# Patient Record
Sex: Female | Born: 1980 | Race: Black or African American | Hispanic: No | Marital: Single | Smoking: Former smoker
Health system: Southern US, Community
[De-identification: ages and names within clinical notes are randomized; demographics above are authoritative.]

## PROBLEM LIST (undated history)

## (undated) DIAGNOSIS — N189 Chronic kidney disease, unspecified: Secondary | ICD-10-CM

## (undated) HISTORY — PX: NO PAST SURGERIES: SHX2092

## (undated) HISTORY — DX: Chronic kidney disease, unspecified: N18.9

---

## 2016-07-17 ENCOUNTER — Emergency Department (HOSPITAL_COMMUNITY)
Admission: EM | Admit: 2016-07-17 | Discharge: 2016-07-18 | Disposition: A | Discharge status: Home / Self Care | Payer: Medicaid Other | Attending: Emergency Medicine | Admitting: Emergency Medicine

## 2016-07-17 ENCOUNTER — Emergency Department (HOSPITAL_COMMUNITY): Payer: Medicaid Other

## 2016-07-17 ENCOUNTER — Encounter (HOSPITAL_COMMUNITY): Payer: Self-pay | Admitting: Emergency Medicine

## 2016-07-17 DIAGNOSIS — D500 Iron deficiency anemia secondary to blood loss (chronic): Secondary | ICD-10-CM | POA: Diagnosis not present

## 2016-07-17 DIAGNOSIS — R0602 Shortness of breath: Secondary | ICD-10-CM | POA: Insufficient documentation

## 2016-07-17 DIAGNOSIS — I10 Essential (primary) hypertension: Secondary | ICD-10-CM | POA: Diagnosis not present

## 2016-07-17 DIAGNOSIS — F17200 Nicotine dependence, unspecified, uncomplicated: Secondary | ICD-10-CM | POA: Diagnosis not present

## 2016-07-17 DIAGNOSIS — Z79899 Other long term (current) drug therapy: Secondary | ICD-10-CM | POA: Diagnosis not present

## 2016-07-17 MED ORDER — ALBUTEROL SULFATE (2.5 MG/3ML) 0.083% IN NEBU
5.0000 mg | INHALATION_SOLUTION | Freq: Once | RESPIRATORY_TRACT | Status: DC
  Filled 2016-07-17: qty 6

## 2016-07-17 NOTE — ED Triage Notes (Signed)
Pt states she has shortness of breath  Pt states she has had it "for a little while" and it has been getting progressively worse  Pt states she cannot lay down flat because it is hard to breathe

## 2016-07-18 ENCOUNTER — Encounter (HOSPITAL_COMMUNITY): Payer: Self-pay | Admitting: Emergency Medicine

## 2016-07-18 LAB — I-STAT TROPONIN, ED: TROPONIN I, POC: 0 ng/mL (ref 0.00–0.08)

## 2016-07-18 LAB — CBC WITH DIFFERENTIAL/PLATELET
BASOS ABS: 0 10*3/uL (ref 0.0–0.1)
Basophils Relative: 1 %
Eosinophils Absolute: 0.3 10*3/uL (ref 0.0–0.7)
Eosinophils Relative: 3 %
HCT: 23.7 % — ABNORMAL LOW (ref 36.0–46.0)
HEMOGLOBIN: 7.6 g/dL — AB (ref 12.0–15.0)
LYMPHS ABS: 2.5 10*3/uL (ref 0.7–4.0)
LYMPHS PCT: 31 %
MCH: 24.8 pg — ABNORMAL LOW (ref 26.0–34.0)
MCHC: 32.1 g/dL (ref 30.0–36.0)
MCV: 77.2 fL — AB (ref 78.0–100.0)
Monocytes Absolute: 0.5 10*3/uL (ref 0.1–1.0)
Monocytes Relative: 6 %
NEUTROS ABS: 4.6 10*3/uL (ref 1.7–7.7)
NEUTROS PCT: 59 %
PLATELETS: 250 10*3/uL (ref 150–400)
RBC: 3.07 MIL/uL — AB (ref 3.87–5.11)
RDW: 14.8 % (ref 11.5–15.5)
WBC: 7.9 10*3/uL (ref 4.0–10.5)

## 2016-07-18 LAB — D-DIMER, QUANTITATIVE: D-Dimer, Quant: 0.29 ug/mL-FEU (ref 0.00–0.50)

## 2016-07-18 LAB — I-STAT CHEM 8, ED
BUN: 8 mg/dL (ref 6–20)
CHLORIDE: 103 mmol/L (ref 101–111)
Calcium, Ion: 1.22 mmol/L (ref 1.15–1.40)
Creatinine, Ser: 0.7 mg/dL (ref 0.44–1.00)
GLUCOSE: 123 mg/dL — AB (ref 65–99)
HCT: 23 % — ABNORMAL LOW (ref 36.0–46.0)
Hemoglobin: 7.8 g/dL — ABNORMAL LOW (ref 12.0–15.0)
POTASSIUM: 3.4 mmol/L — AB (ref 3.5–5.1)
Sodium: 138 mmol/L (ref 135–145)
TCO2: 28 mmol/L (ref 0–100)

## 2016-07-18 LAB — I-STAT BETA HCG BLOOD, ED (MC, WL, AP ONLY)

## 2016-07-18 MED ORDER — HYDROCHLOROTHIAZIDE 25 MG PO TABS: 25.0000 mg | ORAL_TABLET | Freq: Every day | ORAL | 0 refills | Status: DC

## 2016-07-18 MED ORDER — FERROUS SULFATE 325 (65 FE) MG PO TABS: 325.0000 mg | ORAL_TABLET | Freq: Three times a day (TID) | ORAL | 0 refills | Status: DC

## 2016-07-18 MED ORDER — ALBUTEROL SULFATE (2.5 MG/3ML) 0.083% IN NEBU
2.5000 mg | INHALATION_SOLUTION | Freq: Once | RESPIRATORY_TRACT | Status: AC
  Administered 2016-07-18: 2.5 mg via RESPIRATORY_TRACT

## 2016-07-18 NOTE — ED Provider Notes (Signed)
[facility]-EMERGENCY DEPT Provider Note   CSN: [id number] Arrival date & time: 07/17/16  2213 By signing my name below, I, Karren CobbleNy'kea Lewis, attest that this documentation has been prepared under the direction and in the presence of Veida Spira, MD. Electronically Signed: Karren CobbleNy'kea Lewis, ED Scribe. 07/18/16. 12:34 AM.  History   Chief Complaint Chief Complaint  Patient presents with  . Shortness of Breath   The history is provided by the patient. No language interpreter was used.  Shortness of Breath  This is a recurrent problem. The problem occurs continuously.The current episode started more than 1 week ago. The problem has been gradually worsening. Pertinent negatives include no fever, no headaches, no coryza, no rhinorrhea, no sore throat, no swollen glands, no ear pain, no neck pain, no cough, no sputum production, no hemoptysis, no wheezing, no PND, no orthopnea, no chest pain, no syncope, no vomiting, no abdominal pain, no rash, no leg pain, no leg swelling and no claudication. It is unknown what precipitated the problem. She has tried nothing for the symptoms. The treatment provided no relief. She has had no prior hospitalizations. She has had no prior ED visits. Associated medical issues do not include asthma, COPD, pneumonia, chronic lung disease, PE, CAD, heart failure, past MI, DVT or recent surgery.   HPI Comments: Molly Leon is a 36 y.o. female with no pertinent PMHx who presents to the Emergency Department complaining of intermittent, progressively worsening shortness of breath "for a while" which significantly worsened today. She notes her breathing is worsened with exertion as well as while lying on her back. Per pt, she is unable to walk up more than 5 stairs before her breathing worsens. Her last long distance trip was around one month ago to [place], and she denies any SOB prior to that trip. She has a hx of seasonal allergies but her symptoms are not being controlled with  medication. Pt reports taking birth control, but denies any other daily medications. She is an occasional smoker. She denies PMHx of asthma, heart disease or emphysema. She denies congestion, rhinorrhea, edema or leg pain, and has no other acute complaints at this time.   History reviewed. No pertinent past medical history.  There are no active problems to display for this patient.  History reviewed. No pertinent surgical history.  OB History    No data available      Home Medications    Prior to Admission medications   Medication Sig Start Date End Date Taking? Authorizing Provider  PRESCRIPTION MEDICATION Take 1 tablet by mouth daily.   Yes Historical Provider, MD    Family History Family History  Problem Relation Age of Onset  . Diabetes Mother   . Hypertension Mother   . Diabetes Father   . Hypertension Father     Social History Social History  Substance Use Topics  . Smoking status: Current Some Day Smoker  . Smokeless tobacco: Never Used  . Alcohol use Yes     Comment: occ     Allergies   Patient has no known allergies.   Review of Systems Review of Systems  Constitutional: Negative for fever.  HENT: Negative for congestion, ear pain, rhinorrhea and sore throat.   Respiratory: Positive for shortness of breath. Negative for cough, hemoptysis, sputum production, wheezing and stridor.   Cardiovascular: Negative for chest pain, orthopnea, claudication, leg swelling, syncope and PND.  Gastrointestinal: Negative for abdominal pain and vomiting.  Musculoskeletal: Negative for myalgias and neck pain.  Skin: Negative  for rash.  Neurological: Negative for headaches.  All other systems reviewed and are negative.  Physical Exam Updated Vital Signs BP (!) 144/86 (BP Location: Left Arm)   Pulse (!) 108   Temp 97.5 F (36.4 C) (Oral)   Resp 18   LMP 07/14/2016   SpO2 98%   Physical Exam  Constitutional: She appears well-developed and well-nourished.  HENT:    Head: Normocephalic.  Mouth/Throat: Oropharynx is clear and moist. No oropharyngeal exudate.  Moist mucus membranes. No exudates.   Eyes: Conjunctivae and EOM are normal. Pupils are equal, round, and reactive to light. Right eye exhibits no discharge. Left eye exhibits no discharge. No scleral icterus.  Neck: Normal range of motion. Neck supple. No JVD present. No tracheal deviation present.  Trachea is midline. No stridor or carotid bruits.   Cardiovascular: Normal rate, regular rhythm, normal heart sounds and intact distal pulses.  Exam reveals no gallop and no friction rub.   No murmur heard. Pulmonary/Chest: Effort normal. No stridor. No respiratory distress. She has no wheezes. She has no rales.  Diminished sounds bilaterally.   Abdominal: Soft. Bowel sounds are normal. She exhibits no distension and no mass. There is no tenderness. There is no rebound and no guarding.  Musculoskeletal: Normal range of motion. She exhibits no edema, tenderness or deformity.  All compartments are soft. No palpable cords.   Lymphadenopathy:    She has no cervical adenopathy.  Neurological: She is alert. She has normal reflexes. She displays normal reflexes. She exhibits normal muscle tone.  2+ DTR.   Skin: Skin is warm and dry. Capillary refill takes less than 2 seconds.  Psychiatric: She has a normal mood and affect. Her behavior is normal.  Nursing note and vitals reviewed.  ED Treatments / Results   Vitals:   07/17/16 2216 07/18/16 0041  BP: (!) 144/86 (!) 145/87  Pulse: (!) 108 85  Resp: 18 (!) 21  Temp: 97.5 F (36.4 C)   \ DIAGNOSTIC STUDIES: Oxygen Saturation is 98% on RA, normal by my interpretation.   COORDINATION OF CARE: 12:19 AM-Discussed next steps with pt which includes lab work. Pt verbalized understanding and is agreeable with the plan.    Labs (all labs ordered are listed, but only abnormal results are displayed)  Results for orders placed or performed during the  hospital encounter of 07/17/16  CBC with Differential/Platelet  Result Value Ref Range   WBC 7.9 4.0 - 10.5 K/uL   RBC 3.07 (L) 3.87 - 5.11 MIL/uL   Hemoglobin 7.6 (L) 12.0 - 15.0 g/dL   HCT 16.1 (L) 09.6 - 04.5 %   MCV 77.2 (L) 78.0 - 100.0 fL   MCH 24.8 (L) 26.0 - 34.0 pg   MCHC 32.1 30.0 - 36.0 g/dL   RDW 40.9 81.1 - 91.4 %   Platelets 250 150 - 400 K/uL   Neutrophils Relative % 59 %   Neutro Abs 4.6 1.7 - 7.7 K/uL   Lymphocytes Relative 31 %   Lymphs Abs 2.5 0.7 - 4.0 K/uL   Monocytes Relative 6 %   Monocytes Absolute 0.5 0.1 - 1.0 K/uL   Eosinophils Relative 3 %   Eosinophils Absolute 0.3 0.0 - 0.7 K/uL   Basophils Relative 1 %   Basophils Absolute 0.0 0.0 - 0.1 K/uL  D-dimer, quantitative (not at [facility])  Result Value Ref Range   D-Dimer, Quant 0.29 0.00 - 0.50 ug/mL-FEU  I-stat chem 8, ed  Result Value Ref Range   Sodium  138 135 - 145 mmol/L   Potassium 3.4 (L) 3.5 - 5.1 mmol/L   Chloride 103 101 - 111 mmol/L   BUN 8 6 - 20 mg/dL   Creatinine, Ser 1.61 0.44 - 1.00 mg/dL   Glucose, Bld 096 (H) 65 - 99 mg/dL   Calcium, Ion 0.45 4.09 - 1.40 mmol/L   TCO2 28 0 - 100 mmol/L   Hemoglobin 7.8 (L) 12.0 - 15.0 g/dL   HCT 81.1 (L) 91.4 - 78.2 %  I-Stat Beta hCG blood, ED ([facility], [facility], [facility] only)  Result Value Ref Range   I-stat hCG, quantitative <5.0 <5 mIU/mL   Comment 3          I-Stat Troponin, ED (not at [facility])  Result Value Ref Range   Troponin i, poc 0.00 0.00 - 0.08 ng/mL   Comment 3           Dg Chest 2 View  Result Date: 07/17/2016 CLINICAL DATA:  Dyspnea for 3 days, positional. EXAM: CHEST  2 VIEW COMPARISON:  None. FINDINGS: The lungs are clear. The pulmonary vasculature is normal. Heart size is normal. Hilar and mediastinal contours are unremarkable. There is no pleural effusion. IMPRESSION: No active cardiopulmonary disease. Electronically Signed   By: Ellery Plunk M.D.   On: 07/17/2016 23:48    EKG  EKG Interpretation  Date/Time:  Wednesday Jul 17 2016  22:22:34 EDT Ventricular Rate:  93 PR Interval:    QRS Duration: 102 QT Interval:  333 QTC Calculation: 415 R Axis:   64 Text Interpretation:  Sinus rhythm Left atrial enlargement RSR' in V1 or V2, probably normal variant Borderline repolarization abnormality Baseline wander in lead(s) III aVF No old tracing to compare Confirmed by Ethelda Chick  MD, SAM [phone]) on 07/17/2016 10:39:39 PM       Radiology Dg Chest 2 View  Result Date: 07/17/2016 CLINICAL DATA:  Dyspnea for 3 days, positional. EXAM: CHEST  2 VIEW COMPARISON:  None. FINDINGS: The lungs are clear. The pulmonary vasculature is normal. Heart size is normal. Hilar and mediastinal contours are unremarkable. There is no pleural effusion. IMPRESSION: No active cardiopulmonary disease. Electronically Signed   By: Ellery Plunk M.D.   On: 07/17/2016 23:48    Procedures Procedures (including critical care time)  Medications Ordered in ED Medications  albuterol (PROVENTIL) (2.5 MG/3ML) 0.083% nebulizer solution 5 mg (5 mg Nebulization Not Given 07/18/16 0006)    Wells score 0, ddimer is negative excluding PE in this very low risk patient.  Symptoms are likely related to patient's heavy periods and chronic blood loss.  Will need close follow up with a PMD and GYN for ongoing care of both anemia and HTN.  Ruled out for MI in the ED with negative ekg and troponin in the setting of ongoing symptoms > 8 hours duration continuously.    Final Clinical Impressions(s) / ED Diagnoses  Anemia and HTN:  Return immediately for abdominal pain,  fever >101, exertional chest pain shortness of breath  or any concerns. Patient and family verbalize understanding and agree to follow up.  [facility]  Starting patient on iron therapy as well as HCTZ for BP.    I have reviewed the triage vital signs and the nursing notes. Pertinent labs &imaging results that were available during my  care of the patient were reviewed by me and considered in my medical decision making (see chart for details). The patient is nontoxic-appearing  on exam and vital signs are within normal limits.    After history, exam, and medical workup I feel the patient has been appropriately medically screened and is safe for discharge home. Pertinent diagnoses were discussed with the patient. Patient was given return precautions.  I personally performed the services described in this documentation, which was scribed in my presence. The recorded information has been reviewed and is accurate.      Cy Blamer, MD 07/18/16 [phone]

## 2016-10-22 ENCOUNTER — Emergency Department (HOSPITAL_COMMUNITY): Payer: Medicaid Other

## 2016-10-22 ENCOUNTER — Emergency Department (HOSPITAL_COMMUNITY)
Admission: EM | Admit: 2016-10-22 | Discharge: 2016-10-22 | Disposition: A | Discharge status: Home / Self Care | Payer: Medicaid Other | Attending: Emergency Medicine | Admitting: Emergency Medicine

## 2016-10-22 ENCOUNTER — Encounter (HOSPITAL_COMMUNITY): Payer: Self-pay

## 2016-10-22 DIAGNOSIS — F17210 Nicotine dependence, cigarettes, uncomplicated: Secondary | ICD-10-CM | POA: Insufficient documentation

## 2016-10-22 DIAGNOSIS — M25572 Pain in left ankle and joints of left foot: Secondary | ICD-10-CM | POA: Insufficient documentation

## 2016-10-22 DIAGNOSIS — Z79899 Other long term (current) drug therapy: Secondary | ICD-10-CM | POA: Diagnosis not present

## 2016-10-22 MED ORDER — NAPROXEN 500 MG PO TABS: 500.0000 mg | ORAL_TABLET | Freq: Two times a day (BID) | ORAL | 0 refills | Status: DC

## 2016-10-22 NOTE — Discharge Instructions (Signed)
Take the prescribed medication as directed. Can ice and elevate ankle at home to help with pain/swelling as well. Follow-up with your primary care doctor if you have any ongoing issues. Return to the ED for new or worsening symptoms.

## 2016-10-22 NOTE — ED Provider Notes (Signed)
[facility]-EMERGENCY DEPT Provider Note   CSN: [id number] Arrival date & time: 10/22/16  0920     History   Chief Complaint No chief complaint on file.   HPI Molly Leon is a 36 y.o. female.  The history is provided by the patient and medical records.     36 year old female here with left ankle pain. States she first started noticing that yesterday while she was at work. 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 States she usually wears sneakers. States pain along the medial left ankle. She denies any injury, trauma, or falls. Has not had any numbness or weakness of the left leg or ankle. States pain is worse when she initially bears weight but improves after she walks a little bit. No prior left ankle injuries or surgeries in the past.  History reviewed. No pertinent past medical history.  There are no active problems to display for this patient.   History reviewed. No pertinent surgical history.  OB History    No data available       Home Medications    Prior to Admission medications   Medication Sig Start Date End Date Taking? Authorizing Provider  ferrous sulfate 325 (65 FE) MG tablet Take 1 tablet (325 mg total) by mouth 3 (three) times daily with meals. 07/18/16   Palumbo, April, MD  hydrochlorothiazide (HYDRODIURIL) 25 MG tablet Take 1 tablet (25 mg total) by mouth daily. 07/18/16   Palumbo, April, MD  PRESCRIPTION MEDICATION Take 1 tablet by mouth daily.    [provider]    Family History Family History  Problem Relation Age of Onset  . Diabetes Mother   . Hypertension Mother   . Diabetes Father   . Hypertension Father     Social History Social History  Substance Use Topics  . Smoking status: Current Some Day Smoker  . Smokeless tobacco: Never Used  . Alcohol use Yes     Comment: occ     Allergies   Patient has no known allergies.   Review of Systems Review of Systems  Musculoskeletal: Positive for arthralgias.    All other systems reviewed and are negative.    Physical Exam Updated Vital Signs BP (!) 143/71 (BP Location: Right Arm)   Pulse 89   Temp 98.5 F (36.9 C) (Oral)   Resp 18   Ht 5\' 11"  (1.803 m)   Wt 127 kg (280 lb)   SpO2 100%   BMI 39.05 kg/m   Physical Exam  Constitutional: She is oriented to person, place, and time. She appears well-developed and well-nourished.  HENT:  Head: Normocephalic and atraumatic.  Mouth/Throat: Oropharynx is clear and moist.  Eyes: Pupils are equal, round, and reactive to light. Conjunctivae and EOM are normal.  Neck: Normal range of motion.  Cardiovascular: Normal rate, regular rhythm and normal heart sounds.   Pulmonary/Chest: Effort normal and breath sounds normal.  Abdominal: Soft. Bowel sounds are normal. There is no tenderness. There is no rebound.  Musculoskeletal: Normal range of motion.  Point tenderness of left medial malleolus, there is no acute bony deformity, no overlying skin changes, no warmth to touch, full range of motion maintained, pain noted when weightbearing, able to ambulate independently  Neurological: She is alert and oriented to person, place, and time.  Skin: Skin is warm and dry.  Psychiatric: She has a normal mood and affect.  Nursing note and vitals reviewed.    ED Treatments / Results  Labs (all labs ordered are listed, but only abnormal results are displayed) Labs Reviewed - No data to display  EKG  EKG Interpretation None       Radiology Dg Ankle Complete Left  Result Date: 10/22/2016 CLINICAL DATA:  Medial left ankle pain. EXAM: LEFT ANKLE COMPLETE - 3+ VIEW COMPARISON:  None. FINDINGS: Soft tissue swelling, particularly along the medial aspect of the lower leg and ankle. The left ankle is located without a fracture. IMPRESSION: Medial soft tissue swelling.  No acute bone abnormality. Electronically Signed   By: Richarda Overlie M.D.   On: 10/22/2016 09:59    Procedures Procedures (including critical  care time)  Medications Ordered in ED Medications - No data to display   Initial Impression / Assessment and Plan / ED Course  I have reviewed the triage vital signs and the nursing notes.  Pertinent labs & imaging results that were available during my care of the patient were reviewed by me and considered in my medical decision making (see chart for details).  36 year old female here with atraumatic left ankle pain. Reports she is on her feet and walks a lot at work. On exam there is no acute bony deformity, swelling, or overlying skin changes. She is point tender over the medial malleolus. Foot is neurovascularly intact. X-ray obtained and is negative for acute bony findings, does have some soft tissue swelling. Patient placed in ASO brace. Discussed RICE routine, anti-inflammatories. She can follow-up with PCP if any ongoing issues.  Discussed plan with patient, she acknowledged understanding and agreed with plan of care.  Return precautions given for new or worsening symptoms.  Final Clinical Impressions(s) / ED Diagnoses   Final diagnoses:  Acute left ankle pain    New Prescriptions Discharge Medication List as of 10/22/2016 11:14 AM    START taking these medications   Details  naproxen (NAPROSYN) 500 MG tablet Take 1 tablet (500 mg total) by mouth 2 (two) times daily with a meal., Starting Tue 10/22/2016, Print         Garlon Hatchet, PA-C 10/22/16 1143    Long, Arlyss Repress, MD 10/22/16 [phone]

## 2016-10-22 NOTE — ED Triage Notes (Signed)
Patient complains of left ankle pain with ambulation after working yesterday. Denies trauma. No swelling, no deformity

## 2017-09-26 DIAGNOSIS — R4582 Worries: Secondary | ICD-10-CM | POA: Diagnosis not present

## 2017-09-26 DIAGNOSIS — I10 Essential (primary) hypertension: Secondary | ICD-10-CM | POA: Diagnosis not present

## 2017-09-26 DIAGNOSIS — E669 Obesity, unspecified: Secondary | ICD-10-CM | POA: Diagnosis not present

## 2017-09-26 DIAGNOSIS — Z72820 Sleep deprivation: Secondary | ICD-10-CM | POA: Diagnosis not present

## 2017-09-26 DIAGNOSIS — Z1329 Encounter for screening for other suspected endocrine disorder: Secondary | ICD-10-CM | POA: Diagnosis not present

## 2017-09-26 DIAGNOSIS — Z13220 Encounter for screening for lipoid disorders: Secondary | ICD-10-CM | POA: Diagnosis not present

## 2017-09-26 DIAGNOSIS — F4321 Adjustment disorder with depressed mood: Secondary | ICD-10-CM | POA: Diagnosis not present

## 2017-09-26 DIAGNOSIS — Z131 Encounter for screening for diabetes mellitus: Secondary | ICD-10-CM | POA: Diagnosis not present

## 2017-11-18 DIAGNOSIS — H5213 Myopia, bilateral: Secondary | ICD-10-CM | POA: Diagnosis not present

## 2017-11-19 DIAGNOSIS — H5213 Myopia, bilateral: Secondary | ICD-10-CM | POA: Diagnosis not present

## 2018-05-28 ENCOUNTER — Ambulatory Visit (INDEPENDENT_AMBULATORY_CARE_PROVIDER_SITE_OTHER): Payer: Medicaid Other | Admitting: Podiatry

## 2018-05-28 ENCOUNTER — Telehealth: Payer: Self-pay | Admitting: Podiatry

## 2018-05-28 ENCOUNTER — Ambulatory Visit (INDEPENDENT_AMBULATORY_CARE_PROVIDER_SITE_OTHER): Payer: Medicaid Other

## 2018-05-28 ENCOUNTER — Other Ambulatory Visit: Payer: Self-pay

## 2018-05-28 ENCOUNTER — Telehealth: Payer: Self-pay | Admitting: *Deleted

## 2018-05-28 ENCOUNTER — Other Ambulatory Visit: Payer: Self-pay | Admitting: Podiatry

## 2018-05-28 DIAGNOSIS — M2142 Flat foot [pes planus] (acquired), left foot: Secondary | ICD-10-CM

## 2018-05-28 DIAGNOSIS — S96912S Strain of unspecified muscle and tendon at ankle and foot level, left foot, sequela: Secondary | ICD-10-CM

## 2018-05-28 DIAGNOSIS — M76829 Posterior tibial tendinitis, unspecified leg: Secondary | ICD-10-CM

## 2018-05-28 DIAGNOSIS — M2141 Flat foot [pes planus] (acquired), right foot: Secondary | ICD-10-CM

## 2018-05-28 DIAGNOSIS — M76822 Posterior tibial tendinitis, left leg: Secondary | ICD-10-CM | POA: Diagnosis not present

## 2018-05-28 DIAGNOSIS — M25579 Pain in unspecified ankle and joints of unspecified foot: Secondary | ICD-10-CM

## 2018-05-28 DIAGNOSIS — M778 Other enthesopathies, not elsewhere classified: Secondary | ICD-10-CM

## 2018-05-28 DIAGNOSIS — M779 Enthesopathy, unspecified: Secondary | ICD-10-CM

## 2018-05-28 MED ORDER — METHYLPREDNISOLONE 4 MG PO TBPK: ORAL_TABLET | ORAL | 0 refills | Status: DC

## 2018-05-28 NOTE — Progress Notes (Signed)
Subjective:   Patient ID: Molly Leon, female   DOB: 38 y.o.   MRN: [id number]   HPI 38 year old female presents the office today with concerns of chronic ankle pain with the left side worse than the right.  She is this been ongoing she actually to the ER where she was given an ankle brace and she has been wearing it since.  She states that she is now with ankle where she could not walk very much.  She has noticed swelling to the inside aspect of her ankle and she points on the medial aspect of her ankle.  She denies any recent injury or trauma.  This is been ongoing negative said for about 2 years and she is been to the ER and had visit for the same issue in 2018.   Review of Systems  All other systems reviewed and are negative.  No past medical history on file.  No past surgical history on file.   Current Outpatient Medications:  .  ferrous sulfate 325 (65 FE) MG tablet, Take 1 tablet (325 mg total) by mouth 3 (three) times daily with meals., Disp: 90 tablet, Rfl: 0 .  hydrochlorothiazide (HYDRODIURIL) 25 MG tablet, Take 1 tablet (25 mg total) by mouth daily., Disp: 30 tablet, Rfl: 0 .  methylPREDNISolone (MEDROL DOSEPAK) 4 MG TBPK tablet, Take as directed, Disp: 21 tablet, Rfl: 0 .  naproxen (NAPROSYN) 500 MG tablet, Take 1 tablet (500 mg total) by mouth 2 (two) times daily with a meal., Disp: 30 tablet, Rfl: 0 .  PRESCRIPTION MEDICATION, Take 1 tablet by mouth daily., Disp: , Rfl:   No Known Allergies       Objective:  Physical Exam  General: AAO x3, NAD  Dermatological: Skin is warm, dry and supple bilateral. Nails x 10 are well manicured; remaining integument appears unremarkable at this time. There are no open sores, no preulcerative lesions, no rash or signs of infection present.  Vascular: Dorsalis Pedis artery and Posterior Tibial artery pedal pulses are 2/4 bilateral with immedate capillary fill time. No varicosities and no lower extremity edema present bilateral.  There is no pain with calf compression, swelling, warmth, erythema.   Neruologic: Grossly intact via light touch bilateral. Vibratory intact via tuning fork bilateral. Protective threshold with Semmes Wienstein monofilament intact to all pedal sites bilateral.  Negative Tinel sign.  Musculoskeletal: There is tenderness palpation of the course the posterior tibial tendon on the left side worse than right on the left side there is edema along the course the posterior tibial tendon inferior and posterior to the medial malleolus.  She is able to do double heel rise but she is not able to perform a single heel rise on the left side.  There is a significant flatfoot present.  Overall  she feels that her arch has been falling more over the last 2 years.  Equinus is present.  MMT 5/5. Gait: Unassisted, Nonantalgic.       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 No evidence of acute fracture or stress fracture identified today.  There is a flatfoot deformity evident. -At this time given that her symptoms been ongoing for quite some time she had an ER visit 2018 she had treatment since then I would recommend an MRI to out a posterior  tibial tendon tear.  In the meantime immobilize her in a cam boot and a prescription for [facility] was provided.  Prescribed Medrol Dosepak.  Ice elevation.  Vivi Barrack DPM

## 2018-05-28 NOTE — Telephone Encounter (Signed)
Its done. She had an ER visit in 2018 for the same issue so hopefully that helps.

## 2018-05-28 NOTE — Telephone Encounter (Signed)
Sent to the pharmacy. I was going to do it. She came in when the computers were down.

## 2018-05-28 NOTE — Telephone Encounter (Signed)
Patient was seen in office this morning and was supposed to have a prescription sent in to her pharmacy. When the patient called the pharmacy they had not received the prescriptions. Pt calling to follow up. She would like for the prescriptions to be sent to the [facility] on [address].

## 2018-05-28 NOTE — Patient Instructions (Signed)
I have ordered an MRI of the left ankle to look for a tear. If you do not hear from anyone within a week please give [place] a call.  Go by [facility] to get a CAM boot. Please call them before going to get it  If was nice to meet you today. If you have any questions or any further concerns, please feel fee to give me a call. You can call our office at [phone] or please feel fee to send me a message through MyChart.

## 2018-05-28 NOTE — Telephone Encounter (Signed)
-----   Message from Vivi Barrack, DPM sent at 05/28/2018 10:45 AM EDT ----- Can you please order an MRI of the left ankle to rule out tendon tear of the posterior tibial tendon?

## 2018-05-29 NOTE — Telephone Encounter (Signed)
[employer] - Medicaid requires clinicals for review Case:[id number] for MRI left ankle [id number]. Faxed clinicals to [employer].

## 2018-06-01 DIAGNOSIS — R7303 Prediabetes: Secondary | ICD-10-CM | POA: Diagnosis not present

## 2018-06-01 DIAGNOSIS — L659 Nonscarring hair loss, unspecified: Secondary | ICD-10-CM | POA: Diagnosis not present

## 2018-06-01 DIAGNOSIS — R202 Paresthesia of skin: Secondary | ICD-10-CM | POA: Diagnosis not present

## 2018-06-02 NOTE — Telephone Encounter (Signed)
[employer] - MEDICAID NOTIFICATION OF AUTHORIZATION OF MRI LEFT ANKLE [id number], AUTHORIZATION:  [id number], EFFECTIVE: 05/29/2018, END:  06/28/2018. Faxed authorization and orders to [facility].

## 2018-06-02 NOTE — Telephone Encounter (Signed)
Left message informing pt we had received prior authorization for her MRI.

## 2018-06-04 ENCOUNTER — Telehealth: Payer: Self-pay | Admitting: Podiatry

## 2018-06-04 MED ORDER — DICLOFENAC SODIUM 75 MG PO TBEC: 75.0000 mg | DELAYED_RELEASE_TABLET | Freq: Two times a day (BID) | ORAL | 0 refills | Status: DC

## 2018-06-04 NOTE — Addendum Note (Signed)
Addended by: Alphia Kava D on: 06/04/2018 02:30 PM   Modules accepted: Orders

## 2018-06-04 NOTE — Telephone Encounter (Signed)
I called pt, and asked if she had completed the 6 day steroid pack and she states she did today. Pt states she had previously taken OTC aleve and ibuprofen without results, which is why she came to the doctor. I instructed pt to continue the boot, rest and ice 3-4 times daily for 15-20 minutes/session protecting the skin from the ice with a cloth, and I would ask the doctor-on-call from an antiinflammatory prescription strength. I gave pt [facility] [phone] again to schedule for MRI.

## 2018-06-04 NOTE — Telephone Encounter (Signed)
Patient was seen recently for bilateral ankle pain and was prescribed a Medrol Dosepak and states that it is not helping with the pain. Please give patient a call to discuss some options for helping with pain.

## 2018-06-04 NOTE — Telephone Encounter (Signed)
Voltaren #60 75 mg.

## 2018-06-04 NOTE — Telephone Encounter (Signed)
Dr. Charlsie Merles ordered voltaren 75mg  #60 one bid with food. I informed pt of Dr. Beverlee Nims orders. Pt states she is scheduled for the MRI 06/09/2018.

## 2018-06-09 ENCOUNTER — Other Ambulatory Visit: Payer: Self-pay

## 2018-06-09 ENCOUNTER — Telehealth: Payer: Self-pay | Admitting: Podiatry

## 2018-06-09 NOTE — Telephone Encounter (Signed)
The Rx send to [facility] on [address], a form needs to be filled out and submitted to the insurance. I would like a call back please.

## 2018-06-12 ENCOUNTER — Other Ambulatory Visit: Payer: Self-pay

## 2018-06-12 ENCOUNTER — Telehealth: Payer: Self-pay | Admitting: Podiatry

## 2018-06-12 DIAGNOSIS — S96912S Strain of unspecified muscle and tendon at ankle and foot level, left foot, sequela: Secondary | ICD-10-CM

## 2018-06-12 DIAGNOSIS — M2142 Flat foot [pes planus] (acquired), left foot: Secondary | ICD-10-CM

## 2018-06-12 DIAGNOSIS — M76829 Posterior tibial tendinitis, unspecified leg: Secondary | ICD-10-CM

## 2018-06-12 DIAGNOSIS — M2141 Flat foot [pes planus] (acquired), right foot: Secondary | ICD-10-CM

## 2018-06-12 MED ORDER — MELOXICAM 15 MG PO TABS: 15.0000 mg | ORAL_TABLET | Freq: Every day | ORAL | 1 refills | Status: DC

## 2018-06-12 NOTE — Telephone Encounter (Signed)
I informed pt the Meloxicam was a medicaid covered medication and the MRI had be changed to [facility] and she could Engineer, building services for an appt.

## 2018-06-12 NOTE — Telephone Encounter (Signed)
Pt states she hasn't gotten the medication because it has not been approved. I spoke with Tomasa Hose, RN and she states orders were changed to Meloxicam, which is covered by Medicaid.

## 2018-06-12 NOTE — Telephone Encounter (Signed)
Pt called stating that she is unable to pick up prescription because the Pharmacy is waiting on prior authorization from insurance company. Pharmacy states they have sent over several requests and have not heard anything back.  Also, patient was scheduled for MRI but missed her appointment and when trying to reschedule they told her that it would be May before they could see her. Patient would like to know if we can send a referral to [facility] in [facility] because they stated that they could see the patient sooner than May.

## 2018-06-12 NOTE — Telephone Encounter (Signed)
[employer] changed the location of pt's MRI to [facility] in [facility] and stated the expiration and authorization would remain the same [id number], and expiration 06/28/2018.

## 2018-06-12 NOTE — Addendum Note (Signed)
Addended by: Alphia Kava D on: 06/12/2018 12:52 PM   Modules accepted: Orders

## 2018-06-18 ENCOUNTER — Ambulatory Visit: Payer: Medicaid Other | Admitting: Podiatry

## 2018-06-25 ENCOUNTER — Ambulatory Visit: Payer: Medicaid Other | Admitting: Podiatry

## 2018-07-03 ENCOUNTER — Telehealth: Payer: Self-pay | Admitting: Podiatry

## 2018-07-03 NOTE — Telephone Encounter (Signed)
Victorino Dike from [facility] called requesting a prior authorization for patient.

## 2018-07-06 ENCOUNTER — Other Ambulatory Visit: Payer: Self-pay

## 2018-07-06 DIAGNOSIS — B360 Pityriasis versicolor: Secondary | ICD-10-CM | POA: Diagnosis not present

## 2018-07-06 DIAGNOSIS — L659 Nonscarring hair loss, unspecified: Secondary | ICD-10-CM | POA: Diagnosis not present

## 2018-07-30 ENCOUNTER — Other Ambulatory Visit: Payer: Self-pay | Admitting: Podiatry

## 2018-08-04 ENCOUNTER — Inpatient Hospital Stay: Admission: RE | Admit: 2018-08-04 | Payer: Self-pay | Source: Ambulatory Visit

## 2019-06-03 DIAGNOSIS — N939 Abnormal uterine and vaginal bleeding, unspecified: Secondary | ICD-10-CM | POA: Diagnosis not present

## 2019-06-03 DIAGNOSIS — N946 Dysmenorrhea, unspecified: Secondary | ICD-10-CM | POA: Diagnosis not present

## 2019-06-03 DIAGNOSIS — Z13220 Encounter for screening for lipoid disorders: Secondary | ICD-10-CM | POA: Diagnosis not present

## 2019-06-03 DIAGNOSIS — Z7251 High risk heterosexual behavior: Secondary | ICD-10-CM | POA: Diagnosis not present

## 2019-06-03 DIAGNOSIS — Z131 Encounter for screening for diabetes mellitus: Secondary | ICD-10-CM | POA: Diagnosis not present

## 2019-06-03 DIAGNOSIS — I10 Essential (primary) hypertension: Secondary | ICD-10-CM | POA: Diagnosis not present

## 2019-06-03 DIAGNOSIS — E559 Vitamin D deficiency, unspecified: Secondary | ICD-10-CM | POA: Diagnosis not present

## 2019-06-03 DIAGNOSIS — Z1329 Encounter for screening for other suspected endocrine disorder: Secondary | ICD-10-CM | POA: Diagnosis not present

## 2019-06-07 DIAGNOSIS — R4589 Other symptoms and signs involving emotional state: Secondary | ICD-10-CM | POA: Diagnosis not present

## 2019-06-07 DIAGNOSIS — F419 Anxiety disorder, unspecified: Secondary | ICD-10-CM | POA: Diagnosis not present

## 2019-06-21 DIAGNOSIS — F4321 Adjustment disorder with depressed mood: Secondary | ICD-10-CM | POA: Diagnosis not present

## 2019-06-21 DIAGNOSIS — R4589 Other symptoms and signs involving emotional state: Secondary | ICD-10-CM | POA: Diagnosis not present

## 2019-06-21 DIAGNOSIS — F419 Anxiety disorder, unspecified: Secondary | ICD-10-CM | POA: Diagnosis not present

## 2019-06-28 DIAGNOSIS — F419 Anxiety disorder, unspecified: Secondary | ICD-10-CM | POA: Diagnosis not present

## 2019-06-28 DIAGNOSIS — R4589 Other symptoms and signs involving emotional state: Secondary | ICD-10-CM | POA: Diagnosis not present

## 2019-06-28 DIAGNOSIS — F4321 Adjustment disorder with depressed mood: Secondary | ICD-10-CM | POA: Diagnosis not present

## 2019-07-07 ENCOUNTER — Ambulatory Visit: Payer: Medicaid Other | Admitting: Obstetrics and Gynecology

## 2019-08-12 DIAGNOSIS — Z1152 Encounter for screening for COVID-19: Secondary | ICD-10-CM | POA: Diagnosis not present

## 2020-02-02 ENCOUNTER — Emergency Department (HOSPITAL_COMMUNITY)
Admission: EM | Admit: 2020-02-02 | Discharge: 2020-02-03 | Disposition: A | Discharge status: Home / Self Care | Payer: Medicaid Other | Attending: Emergency Medicine | Admitting: Emergency Medicine

## 2020-02-02 ENCOUNTER — Emergency Department (HOSPITAL_COMMUNITY): Payer: Medicaid Other

## 2020-02-02 DIAGNOSIS — Z20822 Contact with and (suspected) exposure to covid-19: Secondary | ICD-10-CM | POA: Diagnosis not present

## 2020-02-02 DIAGNOSIS — Z5321 Procedure and treatment not carried out due to patient leaving prior to being seen by health care provider: Secondary | ICD-10-CM | POA: Insufficient documentation

## 2020-02-02 DIAGNOSIS — R0602 Shortness of breath: Secondary | ICD-10-CM | POA: Insufficient documentation

## 2020-02-02 DIAGNOSIS — R509 Fever, unspecified: Secondary | ICD-10-CM | POA: Diagnosis not present

## 2020-02-02 DIAGNOSIS — M791 Myalgia, unspecified site: Secondary | ICD-10-CM | POA: Insufficient documentation

## 2020-02-02 DIAGNOSIS — J189 Pneumonia, unspecified organism: Secondary | ICD-10-CM | POA: Diagnosis not present

## 2020-02-02 DIAGNOSIS — J180 Bronchopneumonia, unspecified organism: Secondary | ICD-10-CM | POA: Diagnosis not present

## 2020-02-02 DIAGNOSIS — R059 Cough, unspecified: Secondary | ICD-10-CM | POA: Diagnosis not present

## 2020-02-02 LAB — RESPIRATORY PANEL BY RT PCR (FLU A&B, COVID)
Influenza A by PCR: NEGATIVE
Influenza B by PCR: NEGATIVE
SARS Coronavirus 2 by RT PCR: NEGATIVE

## 2020-02-02 MED ORDER — ACETAMINOPHEN 325 MG PO TABS
650.0000 mg | ORAL_TABLET | Freq: Once | ORAL | Status: AC | PRN
  Administered 2020-02-02: 650 mg via ORAL
  Filled 2020-02-02: qty 2

## 2020-02-02 NOTE — ED Triage Notes (Signed)
Pt says that she feels like she has a fever, SOB when going short distances, generalized body aches. Symptoms for 3 days. Denies COVID exposure, not vaccinated.

## 2020-02-03 DIAGNOSIS — R509 Fever, unspecified: Secondary | ICD-10-CM | POA: Diagnosis not present

## 2020-02-03 DIAGNOSIS — R059 Cough, unspecified: Secondary | ICD-10-CM | POA: Diagnosis not present

## 2020-02-03 DIAGNOSIS — J180 Bronchopneumonia, unspecified organism: Secondary | ICD-10-CM | POA: Diagnosis not present

## 2020-02-03 DIAGNOSIS — J189 Pneumonia, unspecified organism: Secondary | ICD-10-CM | POA: Diagnosis not present

## 2020-02-03 NOTE — ED Notes (Signed)
Patient stated that they are leaving. LWBS. Moving OTF.

## 2020-02-04 DIAGNOSIS — Z20822 Contact with and (suspected) exposure to covid-19: Secondary | ICD-10-CM | POA: Diagnosis not present

## 2020-02-04 DIAGNOSIS — R509 Fever, unspecified: Secondary | ICD-10-CM | POA: Diagnosis not present

## 2020-02-04 DIAGNOSIS — J039 Acute tonsillitis, unspecified: Secondary | ICD-10-CM | POA: Diagnosis not present

## 2020-02-08 ENCOUNTER — Emergency Department (HOSPITAL_COMMUNITY)
Admission: EM | Admit: 2020-02-08 | Discharge: 2020-02-08 | Disposition: A | Discharge status: Home / Self Care | Payer: Medicaid Other | Attending: Emergency Medicine | Admitting: Emergency Medicine

## 2020-02-08 ENCOUNTER — Other Ambulatory Visit: Payer: Self-pay

## 2020-02-08 ENCOUNTER — Encounter (HOSPITAL_COMMUNITY): Payer: Self-pay

## 2020-02-08 ENCOUNTER — Emergency Department (HOSPITAL_COMMUNITY): Payer: Medicaid Other

## 2020-02-08 DIAGNOSIS — D509 Iron deficiency anemia, unspecified: Secondary | ICD-10-CM | POA: Diagnosis not present

## 2020-02-08 DIAGNOSIS — Z79899 Other long term (current) drug therapy: Secondary | ICD-10-CM | POA: Insufficient documentation

## 2020-02-08 DIAGNOSIS — R0602 Shortness of breath: Secondary | ICD-10-CM | POA: Insufficient documentation

## 2020-02-08 DIAGNOSIS — E876 Hypokalemia: Secondary | ICD-10-CM | POA: Diagnosis not present

## 2020-02-08 DIAGNOSIS — F17200 Nicotine dependence, unspecified, uncomplicated: Secondary | ICD-10-CM | POA: Diagnosis not present

## 2020-02-08 DIAGNOSIS — R059 Cough, unspecified: Secondary | ICD-10-CM | POA: Diagnosis not present

## 2020-02-08 DIAGNOSIS — B349 Viral infection, unspecified: Secondary | ICD-10-CM | POA: Diagnosis not present

## 2020-02-08 LAB — CBC
HCT: 30.3 % — ABNORMAL LOW (ref 36.0–46.0)
Hemoglobin: 8.5 g/dL — ABNORMAL LOW (ref 12.0–15.0)
MCH: 19.3 pg — ABNORMAL LOW (ref 26.0–34.0)
MCHC: 28.1 g/dL — ABNORMAL LOW (ref 30.0–36.0)
MCV: 68.7 fL — ABNORMAL LOW (ref 80.0–100.0)
Platelets: 470 10*3/uL — ABNORMAL HIGH (ref 150–400)
RBC: 4.41 MIL/uL (ref 3.87–5.11)
RDW: 19.4 % — ABNORMAL HIGH (ref 11.5–15.5)
WBC: 11.5 10*3/uL — ABNORMAL HIGH (ref 4.0–10.5)
nRBC: 0.2 % (ref 0.0–0.2)

## 2020-02-08 LAB — BASIC METABOLIC PANEL
Anion gap: 11 (ref 5–15)
BUN: 10 mg/dL (ref 6–20)
CO2: 28 mmol/L (ref 22–32)
Calcium: 9.4 mg/dL (ref 8.9–10.3)
Chloride: 99 mmol/L (ref 98–111)
Creatinine, Ser: 0.79 mg/dL (ref 0.44–1.00)
GFR, Estimated: >60 mL/min (ref >60)
Glucose, Bld: 139 mg/dL — ABNORMAL HIGH (ref 70–99)
Potassium: 3 mmol/L — ABNORMAL LOW (ref 3.5–5.1)
Sodium: 138 mmol/L (ref 135–145)

## 2020-02-08 LAB — LACTIC ACID, PLASMA: Lactic Acid, Venous: 1 mmol/L (ref 0.5–1.9)

## 2020-02-08 LAB — I-STAT BETA HCG BLOOD, ED (MC, WL, AP ONLY): I-stat hCG, quantitative: <5 m[IU]/mL (ref <5)

## 2020-02-08 MED ORDER — POTASSIUM CHLORIDE CRYS ER 20 MEQ PO TBCR
40.0000 meq | EXTENDED_RELEASE_TABLET | Freq: Once | ORAL | Status: AC
  Administered 2020-02-08: 40 meq via ORAL
  Filled 2020-02-08: qty 2

## 2020-02-08 MED ORDER — FERROUS SULFATE 325 (65 FE) MG PO TABS: 325.0000 mg | ORAL_TABLET | Freq: Three times a day (TID) | ORAL | 0 refills | Status: DC

## 2020-02-08 MED ORDER — BUTALBITAL-APAP-CAFFEINE 50-325-40 MG PO TABS
1.0000 | ORAL_TABLET | Freq: Four times a day (QID) | ORAL | 0 refills | Status: AC | PRN
Start: 2020-02-08 — End: 2021-02-07

## 2020-02-08 MED ORDER — POTASSIUM CHLORIDE CRYS ER 20 MEQ PO TBCR: 20.0000 meq | EXTENDED_RELEASE_TABLET | Freq: Every day | ORAL | 0 refills | Status: DC

## 2020-02-08 NOTE — ED Provider Notes (Signed)
[facility] EMERGENCY DEPARTMENT Provider Note   CSN: [id number] Arrival date & time: 02/08/20  1223     History Chief Complaint  Patient presents with  . Generalized Body Aches  . Shortness of Breath    Molly Leon is a 39 y.o. female.  The history is provided by the patient and medical records. No language interpreter was used.  Shortness of Breath    39 year old female presenting with complaint of cold symptoms. Patient report for the past 8 to 9 days he has had fever as high as 105, chills, body aches, sinus congestion, sore throat, cough, shortness of breath, decreased appetite, and overall not feeling well. She mention been seen in the ED several days prior but has left AMA after long wait. She went to urgent care for symptoms and was prescribed cefdinir as well as prednisone for sore throat. She took it for 2 days. She was contact to notify that her strep test and Covid test are negative and was told to discontinue her antibiotic. Yesterday she received a phone call stating that her chest x-ray shows mild pneumonia. Due to continued symptoms, patient decided to come to the ER for further direction. Patient mention she has not been vaccinated for COVID-19 but did sick with Covid infection approximately 2 months ago. She admits to tobacco use but have not been using much lately due to her sickness. No loss of taste or smell no nausea vomiting diarrhea no hemoptysis. No prior history of PE or DVT.  History reviewed. No pertinent past medical history.  There are no problems to display for this patient.   History reviewed. No pertinent surgical history.   OB History   No obstetric history on file.     Family History  Problem Relation Age of Onset  . Diabetes Mother   . Hypertension Mother   . Diabetes Father   . Hypertension Father     Social History   Tobacco Use  . Smoking status: Current Some Day Smoker  . Smokeless tobacco: Never Used    Substance Use Topics  . Alcohol use: Yes    Comment: occ  . Drug use: No    Home Medications Prior to Admission medications   Medication Sig Start Date End Date Taking? Authorizing Provider  ferrous sulfate 325 (65 FE) MG tablet Take 1 tablet (325 mg total) by mouth 3 (three) times daily with meals. 07/18/16   Palumbo, April, MD  hydrochlorothiazide (HYDRODIURIL) 25 MG tablet Take 1 tablet (25 mg total) by mouth daily. 07/18/16   Palumbo, April, MD  meloxicam (MOBIC) 15 MG tablet Take 1 tablet (15 mg total) by mouth daily. 06/12/18   Vivi Barrack, DPM  methylPREDNISolone (MEDROL DOSEPAK) 4 MG TBPK tablet Take as directed 05/28/18   Vivi Barrack, DPM  naproxen (NAPROSYN) 500 MG tablet Take 1 tablet (500 mg total) by mouth 2 (two) times daily with a meal. 10/22/16   Garlon Hatchet, PA-C  PRESCRIPTION MEDICATION Take 1 tablet by mouth daily.    [provider]    Allergies    Patient has no known allergies.  Review of Systems   Review of Systems  Respiratory: Positive for shortness of breath.   All other systems reviewed and are negative.   Physical Exam Updated Vital Signs BP 126/67 (BP Location: Right Arm)   Pulse 92   Temp 98.2 F (36.8 C) (Oral)   Resp 18   Ht 5\' 11"  (1.803 m)  Wt (!) 140.6 kg   SpO2 100%   BMI 43.24 kg/m   Physical Exam Vitals and nursing note reviewed.  Constitutional:      General: She is not in acute distress.    Appearance: She is well-developed. She is obese.  HENT:     Head: Atraumatic.     Mouth/Throat:     Mouth: Mucous membranes are moist.     Pharynx: Oropharynx is clear. No oropharyngeal exudate.  Eyes:     Conjunctiva/sclera: Conjunctivae normal.  Cardiovascular:     Rate and Rhythm: Normal rate and regular rhythm.  Pulmonary:     Effort: Pulmonary effort is normal. No accessory muscle usage.     Breath sounds: Normal breath sounds. No decreased breath sounds, wheezing, rhonchi or rales.  Chest:     Chest wall:  No tenderness.  Musculoskeletal:     Cervical back: Neck supple.     Right lower leg: No tenderness. No edema.     Left lower leg: No tenderness. No edema.  Skin:    Findings: No rash.  Neurological:     Mental Status: She is alert and oriented to person, place, and time.  Psychiatric:        Mood and Affect: Mood normal.     ED Results / Procedures / Treatments   Labs (all labs ordered are listed, but only abnormal results are displayed) Labs Reviewed  BASIC METABOLIC PANEL - Abnormal; Notable for the following components:      Result Value   Potassium 3.0 (*)    Glucose, Bld 139 (*)    All other components within normal limits  CBC - Abnormal; Notable for the following components:   WBC 11.5 (*)    Hemoglobin 8.5 (*)    HCT 30.3 (*)    MCV 68.7 (*)    MCH 19.3 (*)    MCHC 28.1 (*)    RDW 19.4 (*)    Platelets 470 (*)    All other components within normal limits  LACTIC ACID, PLASMA  I-STAT BETA HCG BLOOD, ED ([facility], [facility], [facility] ONLY)    EKG EKG Interpretation  Date/Time:  Tuesday February 08 2020 12:26:01 EST Ventricular Rate:  97 PR Interval:  134 QRS Duration: 92 QT Interval:  344 QTC Calculation: 436 R Axis:   81 Text Interpretation: Normal sinus rhythm Marked ST abnormality, possible inferior subendocardial injury Abnormal ECG ST/T changes in similar distribution to May 2018 Confirmed by Pricilla Loveless [phone]) on 02/08/2020 3:35:40 PM   Radiology DG Chest 2 View  Result Date: 02/08/2020 CLINICAL DATA:  Cough EXAM: CHEST - 2 VIEW COMPARISON:  02/02/2020 FINDINGS: Heart and mediastinal contours are within normal limits. No focal opacities or effusions. No acute bony abnormality. IMPRESSION: No active cardiopulmonary disease. Electronically Signed   By: Charlett Nose M.D.   On: 02/08/2020 16:41    Procedures Procedures (including critical care time)  Medications Ordered in ED Medications  potassium chloride SA (KLOR-CON) CR tablet 40 mEq (has no administration  in time range)    ED Course  I have reviewed the triage vital signs and the nursing notes.  Pertinent labs & imaging results that were available during my care of the patient were reviewed by me and considered in my medical decision making (see chart for details).    MDM Rules/Calculators/A&P                          BP Marland Kitchen)  141/86   Pulse 92   Temp 98.2 F (36.8 C) (Oral)   Resp 20   Ht 5\' 11"  (1.803 m)   Wt (!) 140.6 kg   SpO2 100%   BMI 43.24 kg/m   Final Clinical Impression(s) / ED Diagnoses Final diagnoses:  Viral syndrome  Iron deficiency anemia, unspecified iron deficiency anemia type  Hypokalemia    Rx / DC Orders ED Discharge Orders         Ordered    ferrous sulfate 325 (65 FE) MG tablet  3 times daily with meals        02/08/20 1704    potassium chloride SA (KLOR-CON) 20 MEQ tablet  Daily        02/08/20 1704    butalbital-acetaminophen-caffeine (FIORICET) 50-325-40 MG tablet  Every 6 hours PRN        02/08/20 1704         4:00 PM Patient with cold symptoms for over a week. Had an x-ray of her chest 5 days ago still shows streaky opacity of the right lung base suspicious for bronchopneumonia. She had negative Covid test and negative stress test. Due to persistent symptoms she decided to come here for evaluation. She is not hypoxic. Throat exam unremarkable, lungs clear on auscultation. Will repeat x-ray.  Labs remarkable for hypokalemia with potassium of 3.0, will provide supplementation. She has a hemoglobin of 8.5 however her baseline hemoglobin is 7 therefore I will not address this. I have low suspicion for PE causing her symptoms. She previously tested negative for Covid as well as has had Covid prior therefore I have low suspicion for Covid infection.  4:50 PM Chest x-ray without signs of pneumonia.  Has improved from prior.  At this time patient is not hypoxic, vital signs stable, anticipate residual a viral infection causing her symptoms.  Patient  stable for discharge.  Will provide symptomatic treatment.  Return precaution discussed.  Patient did made aware of low hemoglobin and she should take iron supplementation as previously prescribed.  Molly Leon was evaluated in Emergency Department on 02/08/2020 for the symptoms described in the history of present illness. She was evaluated in the context of the global COVID-19 pandemic, which necessitated consideration that the patient might be at risk for infection with the SARS-CoV-2 virus that causes COVID-19. Institutional protocols and algorithms that pertain to the evaluation of patients at risk for COVID-19 are in a state of rapid change based on information released by regulatory bodies including the CDC and federal and state organizations. These policies and algorithms were followed during the patient's care in the ED.    02/10/2020, PA-C 02/08/20 1706    02/10/20, MD 02/11/20 1101

## 2020-02-08 NOTE — ED Triage Notes (Signed)
Pt reports 2 weeks of generalized body aches, sob on exertion. Seen at [facility] and given cefdinir and prednisone without relief. COVID test negative, CXR showing possible pneumonia. Pt a.o, resp e.u

## 2020-02-08 NOTE — Discharge Instructions (Signed)
Please continue to take medication previously prescribed.  Take Fioricet as needed for headache.  Your potassium level is low today take supplementation as prescribed.  Your hemoglobin is low as well, take iron supplementation.  Follow-up with your doctor for further care.

## 2021-03-30 DIAGNOSIS — R5383 Other fatigue: Secondary | ICD-10-CM | POA: Diagnosis not present

## 2021-03-30 DIAGNOSIS — Z0000 Encounter for general adult medical examination without abnormal findings: Secondary | ICD-10-CM | POA: Diagnosis not present

## 2021-03-30 DIAGNOSIS — Z131 Encounter for screening for diabetes mellitus: Secondary | ICD-10-CM | POA: Diagnosis not present

## 2021-03-30 DIAGNOSIS — E559 Vitamin D deficiency, unspecified: Secondary | ICD-10-CM | POA: Diagnosis not present

## 2021-03-30 DIAGNOSIS — Z1159 Encounter for screening for other viral diseases: Secondary | ICD-10-CM | POA: Diagnosis not present

## 2021-03-30 DIAGNOSIS — E7800 Pure hypercholesterolemia, unspecified: Secondary | ICD-10-CM | POA: Diagnosis not present

## 2021-04-05 DIAGNOSIS — Z6841 Body Mass Index (BMI) 40.0 and over, adult: Secondary | ICD-10-CM | POA: Diagnosis not present

## 2021-04-05 DIAGNOSIS — G4710 Hypersomnia, unspecified: Secondary | ICD-10-CM | POA: Diagnosis not present

## 2021-04-18 DIAGNOSIS — R0683 Snoring: Secondary | ICD-10-CM | POA: Diagnosis not present

## 2021-04-18 DIAGNOSIS — Z6841 Body Mass Index (BMI) 40.0 and over, adult: Secondary | ICD-10-CM | POA: Diagnosis not present

## 2021-04-20 ENCOUNTER — Encounter (HOSPITAL_COMMUNITY): Payer: Self-pay

## 2021-04-20 ENCOUNTER — Emergency Department (HOSPITAL_COMMUNITY)
Admission: EM | Admit: 2021-04-20 | Discharge: 2021-04-20 | Discharge status: Against Medical Advice | Payer: Medicaid Other | Attending: Emergency Medicine | Admitting: Emergency Medicine

## 2021-04-20 ENCOUNTER — Other Ambulatory Visit: Payer: Self-pay

## 2021-04-20 DIAGNOSIS — D649 Anemia, unspecified: Secondary | ICD-10-CM | POA: Insufficient documentation

## 2021-04-20 DIAGNOSIS — Z79899 Other long term (current) drug therapy: Secondary | ICD-10-CM | POA: Diagnosis not present

## 2021-04-20 DIAGNOSIS — I10 Essential (primary) hypertension: Secondary | ICD-10-CM | POA: Insufficient documentation

## 2021-04-20 DIAGNOSIS — R1030 Lower abdominal pain, unspecified: Secondary | ICD-10-CM | POA: Diagnosis not present

## 2021-04-20 LAB — CBC WITH DIFFERENTIAL/PLATELET
Abs Immature Granulocytes: 0.02 10*3/uL (ref 0.00–0.07)
Basophils Absolute: 0.1 10*3/uL (ref 0.0–0.1)
Basophils Relative: 1 %
Eosinophils Absolute: 0.3 10*3/uL (ref 0.0–0.5)
Eosinophils Relative: 4 %
HCT: 31.8 % — ABNORMAL LOW (ref 36.0–46.0)
Hemoglobin: 9 g/dL — ABNORMAL LOW (ref 12.0–15.0)
Immature Granulocytes: 0 %
Lymphocytes Relative: 28 %
Lymphs Abs: 2 10*3/uL (ref 0.7–4.0)
MCH: 20.9 pg — ABNORMAL LOW (ref 26.0–34.0)
MCHC: 28.3 g/dL — ABNORMAL LOW (ref 30.0–36.0)
MCV: 73.8 fL — ABNORMAL LOW (ref 80.0–100.0)
Monocytes Absolute: 0.5 10*3/uL (ref 0.1–1.0)
Monocytes Relative: 7 %
Neutro Abs: 4.2 10*3/uL (ref 1.7–7.7)
Neutrophils Relative %: 60 %
Platelets: 272 10*3/uL (ref 150–400)
RBC: 4.31 MIL/uL (ref 3.87–5.11)
RDW: 27.5 % — ABNORMAL HIGH (ref 11.5–15.5)
Smear Review: NORMAL
WBC: 7.1 10*3/uL (ref 4.0–10.5)
nRBC: 0 % (ref 0.0–0.2)

## 2021-04-20 LAB — URINALYSIS, ROUTINE W REFLEX MICROSCOPIC
Bilirubin Urine: NEGATIVE
Glucose, UA: NEGATIVE mg/dL
Ketones, ur: NEGATIVE mg/dL
Leukocytes,Ua: NEGATIVE
Nitrite: NEGATIVE
Protein, ur: NEGATIVE mg/dL
Specific Gravity, Urine: 1.01 (ref 1.005–1.030)
pH: 6 (ref 5.0–8.0)

## 2021-04-20 LAB — COMPREHENSIVE METABOLIC PANEL
ALT: 18 U/L (ref 0–44)
AST: 18 U/L (ref 15–41)
Albumin: 3.9 g/dL (ref 3.5–5.0)
Alkaline Phosphatase: 73 U/L (ref 38–126)
Anion gap: 5 (ref 5–15)
BUN: 8 mg/dL (ref 6–20)
CO2: 25 mmol/L (ref 22–32)
Calcium: 9 mg/dL (ref 8.9–10.3)
Chloride: 104 mmol/L (ref 98–111)
Creatinine, Ser: 0.49 mg/dL (ref 0.44–1.00)
GFR, Estimated: >60 mL/min (ref >60)
Glucose, Bld: 84 mg/dL (ref 70–99)
Potassium: 3.6 mmol/L (ref 3.5–5.1)
Sodium: 134 mmol/L — ABNORMAL LOW (ref 135–145)
Total Bilirubin: 0.2 mg/dL — ABNORMAL LOW (ref 0.3–1.2)
Total Protein: 7.5 g/dL (ref 6.5–8.1)

## 2021-04-20 LAB — I-STAT BETA HCG BLOOD, ED (MC, WL, AP ONLY): I-stat hCG, quantitative: <5 m[IU]/mL (ref <5)

## 2021-04-20 MED ORDER — KETOROLAC TROMETHAMINE 30 MG/ML IJ SOLN
30.0000 mg | Freq: Once | INTRAMUSCULAR | Status: DC
  Filled 2021-04-20: qty 1

## 2021-04-20 MED ORDER — IBUPROFEN 800 MG PO TABS
800.0000 mg | ORAL_TABLET | Freq: Once | ORAL | Status: AC
  Administered 2021-04-20: 800 mg via ORAL
  Filled 2021-04-20: qty 1

## 2021-04-20 MED ORDER — HYDROCODONE-ACETAMINOPHEN 5-325 MG PO TABS: 1.0000 | ORAL_TABLET | Freq: Once | ORAL | Status: DC

## 2021-04-20 MED ORDER — SODIUM CHLORIDE 0.9 % IV BOLUS: 1000.0000 mL | Freq: Once | INTRAVENOUS | Status: DC

## 2021-04-20 MED ORDER — IBUPROFEN 600 MG PO TABS: 600.0000 mg | ORAL_TABLET | Freq: Four times a day (QID) | ORAL | 0 refills | Status: DC | PRN

## 2021-04-20 NOTE — ED Provider Triage Note (Signed)
Emergency Medicine Provider Triage Evaluation Note  Molly Leon , a 41 y.o. female  was evaluated in triage.  Pt complains of lower abdominal pain.  Patient states that for 5 days she has had left lower quadrant and suprapubic lower abdominal pressure.  She eats that she feels like it is worse.  She states that sitting down makes it worse.  She denies nausea, vomiting, diarrhea, fevers.  She has had good appetite and tolerating food and liquids.  Last bowel movement this morning.  States that she is passing gas.  Denies urinary symptoms.  She states she has irregular periods but had spotting last month.  Last full menstrual cycle was in December.  She denies new vaginal discharge or odors..  Review of Systems  Positive: See above Negative:  Physical Exam  BP (!) 186/98 (BP Location: Left Arm)    Pulse 71    Temp 99 F (37.2 C) (Oral)    Resp 18    Ht 5\' 11"  (1.803 m)    Wt (!) 137.8 kg    SpO2 100%    BMI 42.36 kg/m  Gen:   Awake, no distress   Resp:  Normal effort  MSK:   Moves extremities without difficulty  Other:  Abdomen rounded, soft, non-tender to palpation. +bowel sounds  Medical Decision Making  Medically screening exam initiated at 2:34 PM.  Appropriate orders placed.  Molly Leon was informed that the remainder of the evaluation will be completed by another provider, this initial triage assessment does not replace that evaluation, and the importance of remaining in the ED until their evaluation is complete.     Molly Hillier, PA-C 04/20/21 1436

## 2021-04-20 NOTE — ED Notes (Signed)
Pt said she doesn't want her vitals done she wants her results.  I told the pt she will not be able to get  her results until she sees the doctor since she doesn't have mychart.

## 2021-04-20 NOTE — ED Triage Notes (Signed)
Patient c/o lower abdominal pain x 5 days and states worsening. Patient denies any n/v/d or unusual vaginal discharge.

## 2021-04-20 NOTE — Discharge Instructions (Signed)
You need to establish care with a PCP to get on some medication for your hypertension and chronic anemia.  Please return if your abdominal pain continues.

## 2021-04-20 NOTE — ED Provider Notes (Signed)
[facility]-EMERGENCY DEPT Provider Note   CSN: [id number] Arrival date & time: 04/20/21  1339     History  Chief Complaint  Patient presents with   Abdominal Pain    Molly Leon is a 41 y.o. female.  Pt is a 41 yo bf with a hx of anemia and htn.  Pt said she has had lower abdominal pain for 5 days.  She denies n/v.  She denies vaginal d/c, but said her "area" feels raw.  She said she looked and it looked ok to her.  LMP was in December, but she has a hx of irregular periods.  She is not sexually active with men.  She denies f/c.      Home Medications Prior to Admission medications   Medication Sig Start Date End Date Taking? Authorizing Provider  acetaminophen (TYLENOL) 500 MG tablet Take 1,000 mg by mouth every 8 (eight) hours as needed for moderate pain or fever.    [provider]  cefdinir (OMNICEF) 300 MG capsule Take 300 mg by mouth 2 (two) times daily. 02/04/20   [provider]  ferrous sulfate 325 (65 FE) MG tablet Take 1 tablet (325 mg total) by mouth 3 (three) times daily with meals. 02/08/20   Fayrene Helper, PA-C  potassium chloride SA (KLOR-CON) 20 MEQ tablet Take 1 tablet (20 mEq total) by mouth daily. 02/08/20   Fayrene Helper, PA-C  predniSONE (DELTASONE) 20 MG tablet Take 40 mg by mouth every morning. 02/04/20   [provider]  hydrochlorothiazide (HYDRODIURIL) 25 MG tablet Take 1 tablet (25 mg total) by mouth daily. Patient not taking: Reported on 02/08/2020 07/18/16 02/08/20  Nicanor Alcon, April, MD      Allergies    Patient has no known allergies.    Review of Systems   Review of Systems  Gastrointestinal:  Positive for abdominal pain.  All other systems reviewed and are negative.  Physical Exam Updated Vital Signs BP (!) 186/98 (BP Location: Left Arm)    Pulse 71    Temp 99 F (37.2 C) (Oral)    Resp 18    Ht 5\' 11"  (1.803 m)    Wt (!) 137.8 kg    SpO2 100%    BMI 42.36 kg/m  Physical Exam Vitals and nursing  note reviewed.  Constitutional:      Appearance: She is well-developed. She is obese.  HENT:     Head: Normocephalic and atraumatic.     Mouth/Throat:     Mouth: Mucous membranes are moist.     Pharynx: Oropharynx is clear.  Eyes:     Extraocular Movements: Extraocular movements intact.     Pupils: Pupils are equal, round, and reactive to light.  Cardiovascular:     Rate and Rhythm: Normal rate and regular rhythm.  Pulmonary:     Effort: Pulmonary effort is normal.     Breath sounds: Normal breath sounds.  Abdominal:     General: Abdomen is flat. Bowel sounds are normal.     Palpations: Abdomen is soft.     Tenderness: There is abdominal tenderness in the right lower quadrant and left lower quadrant.  Skin:    General: Skin is warm.     Capillary Refill: Capillary refill takes less than 2 seconds.  Neurological:     General: No focal deficit present.     Mental Status: She is alert and oriented to person, place, and time.  Psychiatric:        Mood and  Affect: Mood normal.        Behavior: Behavior normal.    ED Results / Procedures / Treatments   Labs (all labs ordered are listed, but only abnormal results are displayed) Labs Reviewed  COMPREHENSIVE METABOLIC PANEL - Abnormal; Notable for the following components:      Result Value   Sodium 134 (*)    Total Bilirubin 0.2 (*)    All other components within normal limits  CBC WITH DIFFERENTIAL/PLATELET - Abnormal; Notable for the following components:   Hemoglobin 9.0 (*)    HCT 31.8 (*)    MCV 73.8 (*)    MCH 20.9 (*)    MCHC 28.3 (*)    RDW 27.5 (*)    All other components within normal limits  URINALYSIS, ROUTINE W REFLEX MICROSCOPIC - Abnormal; Notable for the following components:   Hgb urine dipstick SMALL (*)    Bacteria, UA RARE (*)    All other components within normal limits  I-STAT BETA HCG BLOOD, ED ([facility], [facility], [facility] ONLY)    EKG None  Radiology No results found.  Procedures Procedures     Medications Ordered in ED Medications  ibuprofen (ADVIL) tablet 800 mg (has no administration in time range)    ED Course/ Medical Decision Making/ A&P                           Medical Decision Making Amount and/or Complexity of Data Reviewed Labs: ordered. Radiology: ordered.  Risk Prescription drug management.   Pt's labs were reviewed.  She has anemia which is chronic.  She does not take iron.  She is not pregnant.  Other labs were nl.  Pt is hypertensive.  She is not currently on any meds for bp.  Pt refused IV.  I went back to talk to her about needing it for the CT scan.  She then said she just wanted to go home.  She said she did not have the "patience for this."  She refused the pelvic.  She refused any additional treatment or intervention.  I did encourage her to establish a pcp to get on meds for htn and anemia.  She is to return at any time.  She signed out AMA.        Final Clinical Impression(s) / ED Diagnoses Final diagnoses:  Lower abdominal pain  Hypertension, unspecified type  Chronic anemia    Rx / DC Orders ED Discharge Orders     None         Jacalyn Lefevre, MD 04/20/21 [phone]

## 2021-04-21 DIAGNOSIS — E559 Vitamin D deficiency, unspecified: Secondary | ICD-10-CM | POA: Diagnosis not present

## 2021-04-21 DIAGNOSIS — R7303 Prediabetes: Secondary | ICD-10-CM | POA: Diagnosis not present

## 2021-04-21 DIAGNOSIS — F5081 Binge eating disorder: Secondary | ICD-10-CM | POA: Diagnosis not present

## 2021-04-21 DIAGNOSIS — R635 Abnormal weight gain: Secondary | ICD-10-CM | POA: Diagnosis not present

## 2021-04-23 DIAGNOSIS — R002 Palpitations: Secondary | ICD-10-CM | POA: Diagnosis not present

## 2021-04-23 DIAGNOSIS — Z6841 Body Mass Index (BMI) 40.0 and over, adult: Secondary | ICD-10-CM | POA: Diagnosis not present

## 2021-04-23 DIAGNOSIS — R030 Elevated blood-pressure reading, without diagnosis of hypertension: Secondary | ICD-10-CM | POA: Diagnosis not present

## 2021-04-23 DIAGNOSIS — R0609 Other forms of dyspnea: Secondary | ICD-10-CM | POA: Diagnosis not present

## 2021-05-08 ENCOUNTER — Other Ambulatory Visit: Payer: Self-pay | Admitting: Physician Assistant

## 2021-05-08 DIAGNOSIS — R102 Pelvic and perineal pain: Secondary | ICD-10-CM

## 2021-05-09 IMAGING — DX DG CHEST 2V
2 series · 2 of 2 positions shown · non-contrast
Comparison: 02/02/2020

CLINICAL DATA: Cough

EXAM:
CHEST - 2 VIEW

[w chest pa]
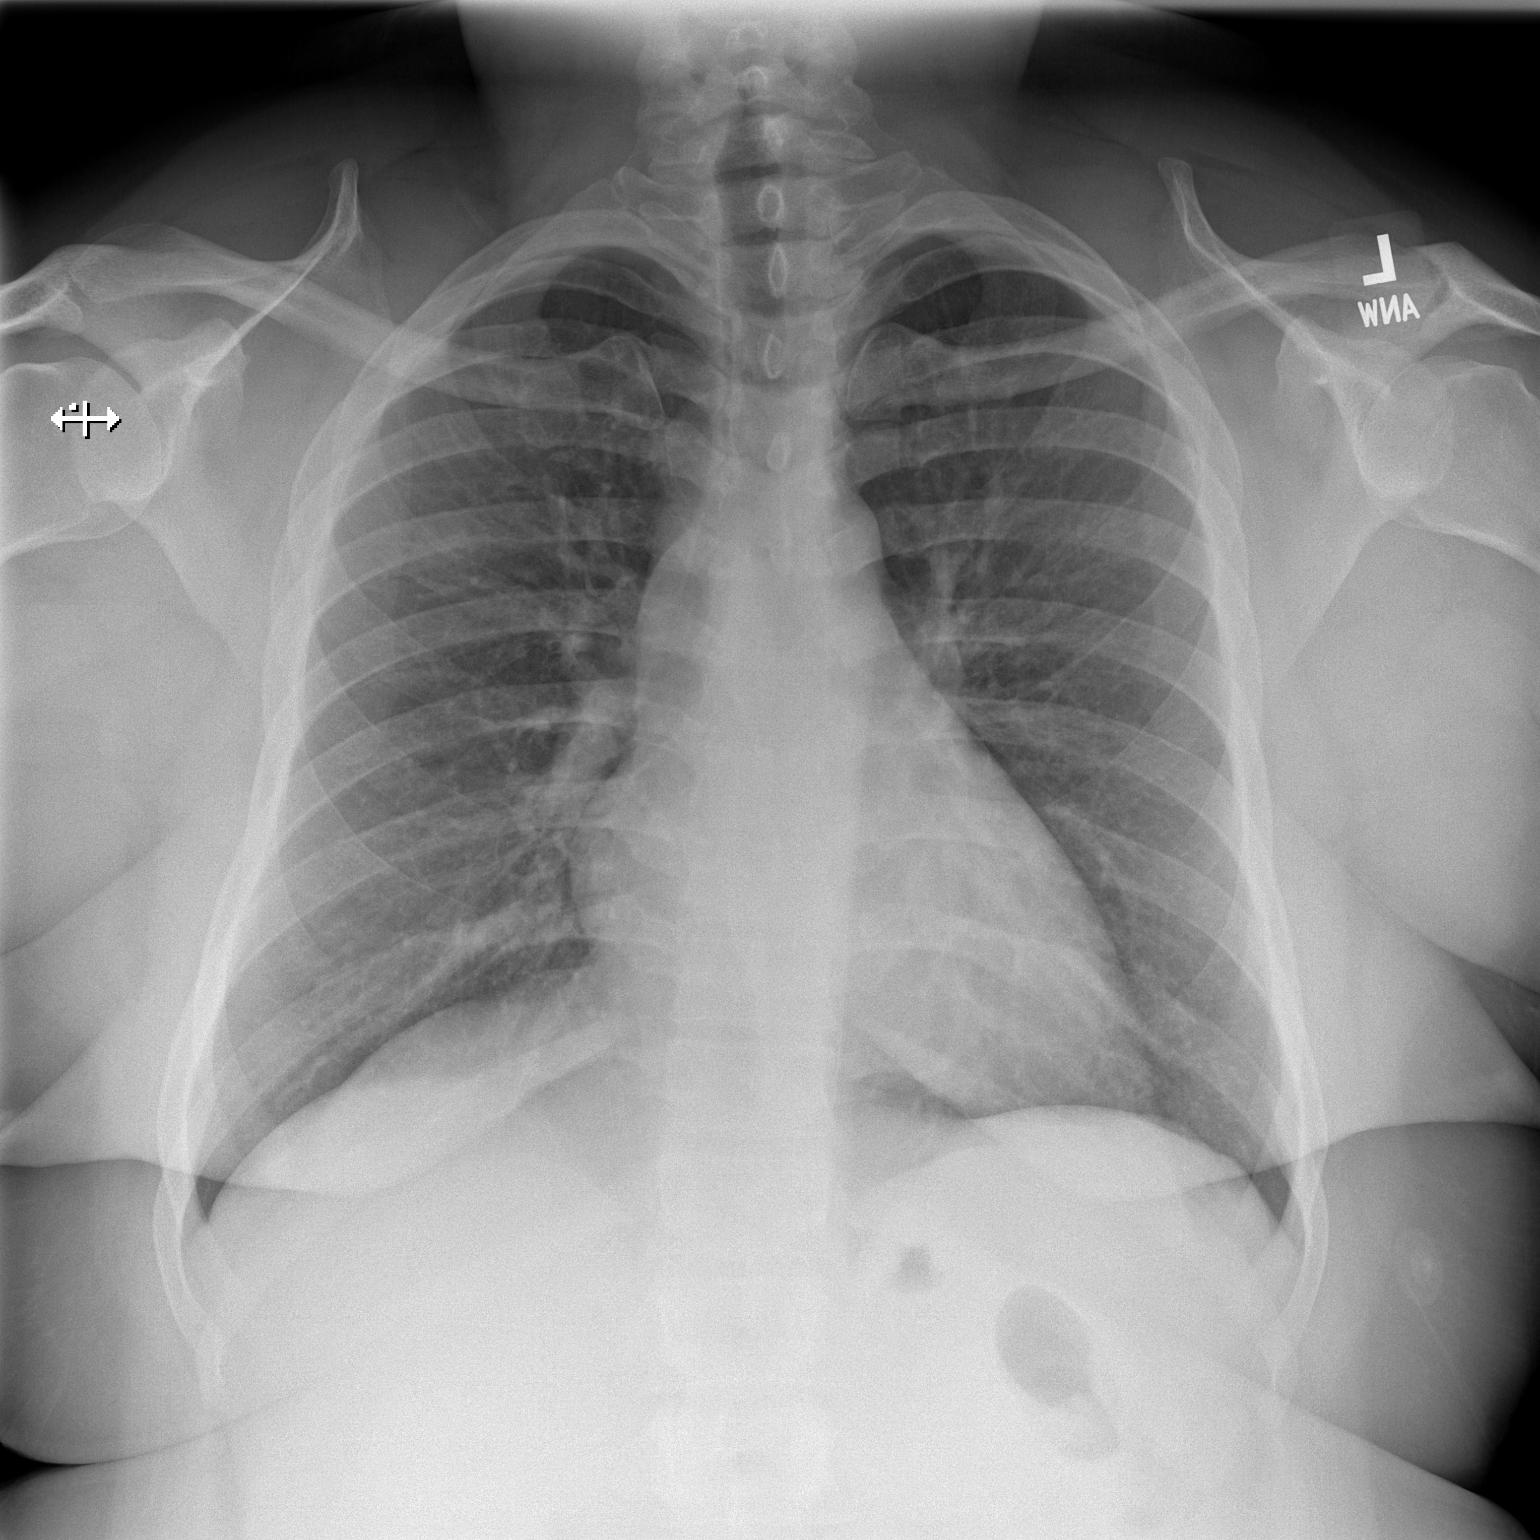

[w chest lat]
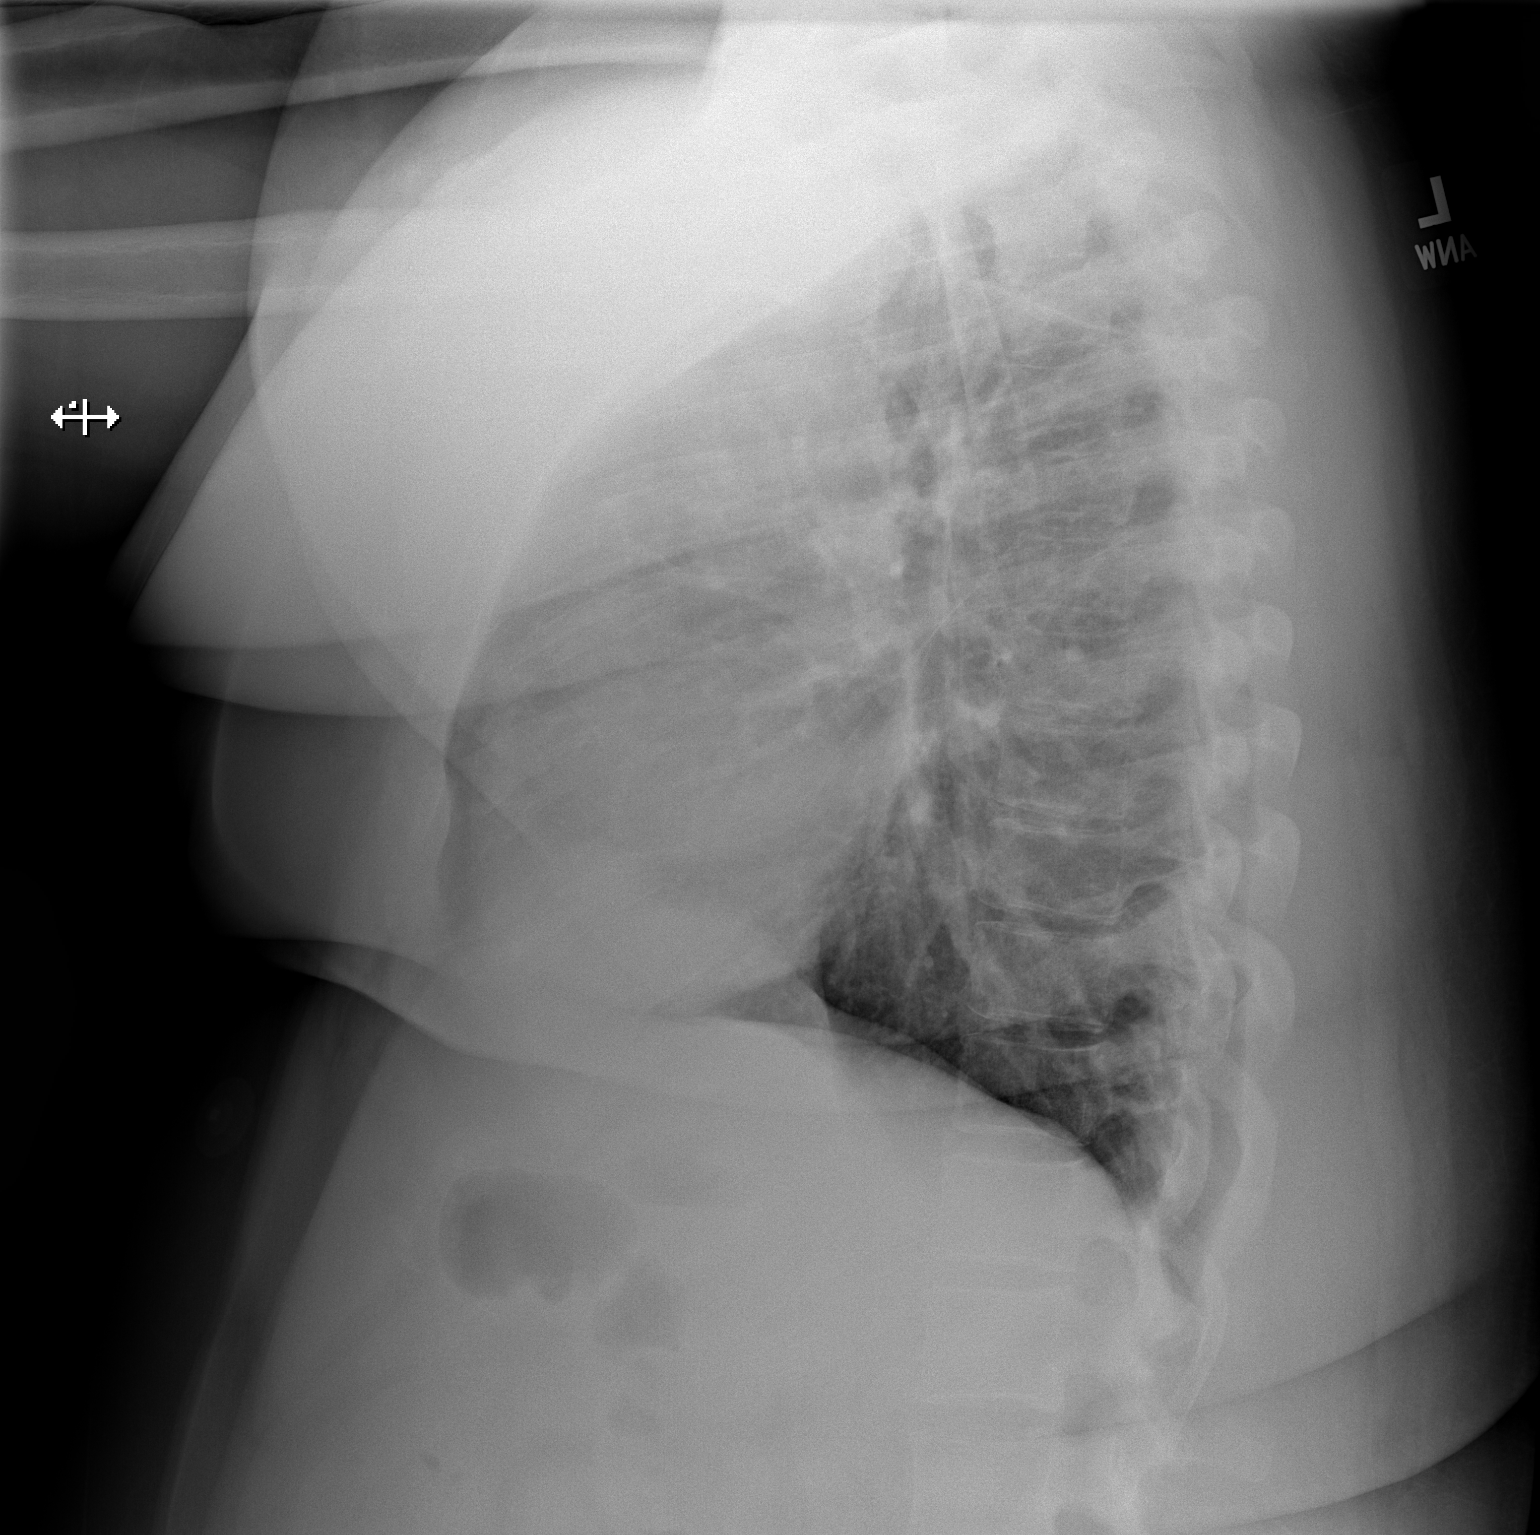

[2 of 2 positions shown; findings below may reference images not displayed]

FINDINGS: Heart and mediastinal contours are within normal limits. No focal
opacities or effusions. No acute bony abnormality.
IMPRESSION: No active cardiopulmonary disease.

## 2021-05-17 ENCOUNTER — Other Ambulatory Visit: Payer: Medicaid Other

## 2021-05-23 ENCOUNTER — Other Ambulatory Visit: Payer: Medicaid Other

## 2021-05-25 DIAGNOSIS — R635 Abnormal weight gain: Secondary | ICD-10-CM | POA: Diagnosis not present

## 2021-05-25 DIAGNOSIS — F5081 Binge eating disorder: Secondary | ICD-10-CM | POA: Diagnosis not present

## 2021-05-25 DIAGNOSIS — E559 Vitamin D deficiency, unspecified: Secondary | ICD-10-CM | POA: Diagnosis not present

## 2021-05-25 DIAGNOSIS — R7303 Prediabetes: Secondary | ICD-10-CM | POA: Diagnosis not present

## 2021-06-04 DIAGNOSIS — Z6841 Body Mass Index (BMI) 40.0 and over, adult: Secondary | ICD-10-CM | POA: Diagnosis not present

## 2021-06-04 DIAGNOSIS — R0609 Other forms of dyspnea: Secondary | ICD-10-CM | POA: Diagnosis not present

## 2021-06-04 DIAGNOSIS — E7800 Pure hypercholesterolemia, unspecified: Secondary | ICD-10-CM | POA: Diagnosis not present

## 2021-06-04 DIAGNOSIS — R002 Palpitations: Secondary | ICD-10-CM | POA: Diagnosis not present

## 2021-08-20 DIAGNOSIS — R002 Palpitations: Secondary | ICD-10-CM | POA: Diagnosis not present

## 2022-02-17 DIAGNOSIS — R197 Diarrhea, unspecified: Secondary | ICD-10-CM | POA: Diagnosis not present

## 2022-02-17 DIAGNOSIS — R059 Cough, unspecified: Secondary | ICD-10-CM | POA: Diagnosis not present

## 2022-02-17 DIAGNOSIS — J069 Acute upper respiratory infection, unspecified: Secondary | ICD-10-CM | POA: Diagnosis not present

## 2022-02-17 DIAGNOSIS — Z6841 Body Mass Index (BMI) 40.0 and over, adult: Secondary | ICD-10-CM | POA: Diagnosis not present

## 2022-02-17 DIAGNOSIS — N914 Secondary oligomenorrhea: Secondary | ICD-10-CM | POA: Diagnosis not present

## 2022-06-07 DIAGNOSIS — E611 Iron deficiency: Secondary | ICD-10-CM | POA: Diagnosis not present

## 2022-06-07 DIAGNOSIS — Z0000 Encounter for general adult medical examination without abnormal findings: Secondary | ICD-10-CM | POA: Diagnosis not present

## 2022-06-07 DIAGNOSIS — R0602 Shortness of breath: Secondary | ICD-10-CM | POA: Diagnosis not present

## 2022-06-07 DIAGNOSIS — E7800 Pure hypercholesterolemia, unspecified: Secondary | ICD-10-CM | POA: Diagnosis not present

## 2022-06-07 DIAGNOSIS — R002 Palpitations: Secondary | ICD-10-CM | POA: Diagnosis not present

## 2022-06-07 DIAGNOSIS — Z3200 Encounter for pregnancy test, result unknown: Secondary | ICD-10-CM | POA: Diagnosis not present

## 2022-06-07 DIAGNOSIS — R5383 Other fatigue: Secondary | ICD-10-CM | POA: Diagnosis not present

## 2022-06-07 DIAGNOSIS — E559 Vitamin D deficiency, unspecified: Secondary | ICD-10-CM | POA: Diagnosis not present

## 2022-06-07 DIAGNOSIS — Z6841 Body Mass Index (BMI) 40.0 and over, adult: Secondary | ICD-10-CM | POA: Diagnosis not present

## 2022-06-07 DIAGNOSIS — Z0130 Encounter for examination of blood pressure without abnormal findings: Secondary | ICD-10-CM | POA: Diagnosis not present

## 2022-06-18 DIAGNOSIS — E559 Vitamin D deficiency, unspecified: Secondary | ICD-10-CM | POA: Diagnosis not present

## 2022-06-18 DIAGNOSIS — E7800 Pure hypercholesterolemia, unspecified: Secondary | ICD-10-CM | POA: Diagnosis not present

## 2022-06-18 DIAGNOSIS — R002 Palpitations: Secondary | ICD-10-CM | POA: Diagnosis not present

## 2022-06-18 DIAGNOSIS — Z0130 Encounter for examination of blood pressure without abnormal findings: Secondary | ICD-10-CM | POA: Diagnosis not present

## 2022-06-18 DIAGNOSIS — R5383 Other fatigue: Secondary | ICD-10-CM | POA: Diagnosis not present

## 2022-06-18 DIAGNOSIS — Z6841 Body Mass Index (BMI) 40.0 and over, adult: Secondary | ICD-10-CM | POA: Diagnosis not present

## 2022-06-18 DIAGNOSIS — E611 Iron deficiency: Secondary | ICD-10-CM | POA: Diagnosis not present

## 2022-06-18 DIAGNOSIS — R7303 Prediabetes: Secondary | ICD-10-CM | POA: Diagnosis not present

## 2022-07-25 ENCOUNTER — Telehealth: Payer: Self-pay

## 2022-07-25 NOTE — Telephone Encounter (Signed)
LVM for patient to call back. AS, CMA 

## 2023-05-19 DIAGNOSIS — U071 COVID-19: Secondary | ICD-10-CM | POA: Diagnosis not present

## 2023-05-19 DIAGNOSIS — R051 Acute cough: Secondary | ICD-10-CM | POA: Diagnosis not present

## 2023-05-21 ENCOUNTER — Encounter: Payer: Self-pay | Admitting: Family Medicine

## 2023-05-21 ENCOUNTER — Ambulatory Visit: Payer: Medicaid Other | Admitting: Family Medicine

## 2023-05-21 VITALS — BP 132/86 | HR 84 | Temp 97.7°F | Ht 71.0 in | Wt 311.4 lb

## 2023-05-21 DIAGNOSIS — Z6841 Body Mass Index (BMI) 40.0 and over, adult: Secondary | ICD-10-CM

## 2023-05-21 DIAGNOSIS — U071 COVID-19: Secondary | ICD-10-CM

## 2023-05-21 DIAGNOSIS — E66813 Obesity, class 3: Secondary | ICD-10-CM | POA: Diagnosis not present

## 2023-05-21 DIAGNOSIS — R21 Rash and other nonspecific skin eruption: Secondary | ICD-10-CM | POA: Diagnosis not present

## 2023-05-21 DIAGNOSIS — I872 Venous insufficiency (chronic) (peripheral): Secondary | ICD-10-CM

## 2023-05-21 MED ORDER — FLUTICASONE PROPIONATE 50 MCG/ACT NA SUSP: 2.0000 | Freq: Every day | NASAL | 6 refills | Status: DC

## 2023-05-21 NOTE — Progress Notes (Signed)
 Assessment/Plan:  Total time spent caring for the patient today was 46 minutes. This includes time spent before the visit reviewing the chart, time spent during the visit, and time spent after the visit on documentation, etc.  COVID-19 Diagnosed on May 19, 2023. Symptoms include fever, chills, myalgia, sore throat, dry throat, postnasal drip, and facial rash. Symptoms improving but persistent. Rash likely post-COVID mobiliform. No chest pain, wheezing, or dyspnea. Discussed Tessalon Perles, albuterol, and Flonase for symptom relief. Informed about pseudoephedrine side effects on blood pressure, heart rate, and insomnia. - Continue Tessalon Perles and albuterol as prescribed - Use Flonase (fluticasone) nasal spray, 2 squirts in each nostril daily - Consider pseudoephedrine (Sudafed) with caution due to potential side effects - Continue OTC ibuprofen and guaifenesin for cough and myalgia  Facial Rash Mobiliform rash on the face, likely post-COVID. Sore to touch, not spreading or significantly pruritic. Uses cocoa butter stick for symptomatic relief. - Continue using cocoa butter stick for symptomatic relief  Lower Leg Swelling Chronic lower leg swelling, likely venous insufficiency. Symptoms include dull tingling and numbness, exacerbated by prolonged standing. No acute pain. Discussed compression stockings post-COVID recovery and ultrasound to rule out DVT and assess venous insufficiency. - Order ultrasound of the legs to rule out DVT and assess venous insufficiency - Consider compression stockings post-COVID recovery  Class III Severe Obesity Concerns about weight management. Plan to address post-COVID recovery with baseline lab work and physical evaluation. Discussed importance of waiting for accurate lab results. - Discuss weight management strategies at follow-up visit post-COVID recovery - Plan for baseline lab work and physical evaluation at follow-up visit  Follow-up - Schedule  follow-up visit in one month - Ensure fasting for lab work at follow-up visit - Obtain previous medical records related to arrhythmia workup.       Medications Discontinued During This Encounter  Medication Reason   predniSONE (DELTASONE) 20 MG tablet    potassium chloride SA (KLOR-CON) 20 MEQ tablet    acetaminophen (TYLENOL) 500 MG tablet    cefdinir (OMNICEF) 300 MG capsule    ferrous sulfate 325 (65 FE) MG tablet     Return in about 1 month (around 06/21/2023) for physical (fasting labs).    Subjective:   Encounter date: 05/21/2023  Molly Leon is a 43 y.o. female who does not have a problem list on file..   She  has no past medical history on file..   She presents with chief complaint of Establish Care (Right and left Leg and ankle swelling. 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 Is covid positive was rx tessalon and albuterol , but hasn't started it. Would like to have pap with CPE's. ) .    History of Present Illness   Molly Leon is a 43 year old female who presents with COVID-19 symptoms, facial rash, and leg swelling.  She began experiencing symptoms consistent with COVID-19 approximately two days ago, including fever, chills, body aches, sore throat, nasal congestion, and shortness of breath. Her throat feels extremely dry despite adequate hydration, and she has a sore, congested nose with postnasal drip. She has been managing her symptoms with over-the-counter medications like Theraflu and Aleve. She was evaluated at urgent care and prescribed Tessalon Perles and albuterol, but has not yet obtained these medications due to insurance and logistical issues.  Concurrently with her COVID-19 symptoms, she developed a facial rash located on her face and neck, described as sore and itchy, particularly under her nose and  on her neck. She has been using alcohol to dry it up and applies a cocoa butter stick to her neck, which she finds soothing.  She has a history of  lower leg swelling, which has been ongoing for several years. She previously consulted a foot doctor who provided a boot, but it exacerbated her back pain. Her occupation as a Scientist, clinical (histocompatibility and immunogenetics) involves prolonged standing, contributing to her symptoms. She describes the swelling as a 'numb falling asleep' sensation from her foot to mid-calf, which worsens by the end of her workday. She uses cold packs for relief but finds the swelling persistent.  In her past medical history, she mentions a previous cardiac evaluation where she was monitored for arrhythmias, which showed some irregularities but nothing alarming. She is currently seeking a new primary care provider after her previous doctor left the practice.       Review of Systems  Respiratory:  Positive for cough. Negative for sputum production, shortness of breath and wheezing.   Cardiovascular:  Positive for leg swelling. Negative for chest pain, palpitations, orthopnea, claudication and PND.  Musculoskeletal:  Positive for myalgias.  Skin:  Positive for rash.  All other systems reviewed and are negative.   History reviewed. No pertinent surgical history.  Outpatient Medications Prior to Visit  Medication Sig Dispense Refill   ibuprofen (ADVIL) 600 MG tablet Take 1 tablet (600 mg total) by mouth every 6 (six) hours as needed. 30 tablet 0   acetaminophen (TYLENOL) 500 MG tablet Take 1,000 mg by mouth every 8 (eight) hours as needed for moderate pain or fever. (Patient not taking: Reported on 05/21/2023)     cefdinir (OMNICEF) 300 MG capsule Take 300 mg by mouth 2 (two) times daily. (Patient not taking: Reported on 05/21/2023)     ferrous sulfate 325 (65 FE) MG tablet Take 1 tablet (325 mg total) by mouth 3 (three) times daily with meals. (Patient not taking: Reported on 05/21/2023) 90 tablet 0   potassium chloride SA (KLOR-CON) 20 MEQ tablet Take 1 tablet (20 mEq total) by mouth daily. (Patient not taking: Reported on 05/21/2023) 3 tablet 0   predniSONE  (DELTASONE) 20 MG tablet Take 40 mg by mouth every morning. (Patient not taking: Reported on 05/21/2023)     No facility-administered medications prior to visit.    Family History  Problem Relation Age of Onset   Diabetes Mother    Hypertension Mother    Diabetes Father    Hypertension Father     Social History   Socioeconomic History   Marital status: Single    Spouse name: Not on file   Number of children: Not on file   Years of education: Not on file   Highest education level: Not on file  Occupational History   Not on file  Tobacco Use   Smoking status: Former    Current packs/day: 0.00    Types: Cigarettes    Quit date: 2023    Years since quitting: 2.1    Passive exposure: Never   Smokeless tobacco: Never  Vaping Use   Vaping status: Never Used  Substance and Sexual Activity   Alcohol use: Yes    Comment: Rar   Drug use: No   Sexual activity: Not on file  Other Topics Concern   Not on file  Social History Narrative   Not on file   Social Drivers of Health   Financial Resource Strain: Not on file  Food Insecurity: Not on file  Transportation Needs:  Not on file  Physical Activity: Not on file  Stress: Not on file  Social Connections: Not on file  Intimate Partner Violence: Not on file                                                                                                  Objective:  Physical Exam: BP 132/86   Pulse 84   Temp 97.7 F (36.5 C) (Temporal)   Ht 5\' 11"  (1.803 m)   Wt (!) 311 lb 6.4 oz (141.3 kg)   SpO2 95%   BMI 43.43 kg/m     Physical Exam Constitutional:      General: She is not in acute distress.    Appearance: Normal appearance. She is not ill-appearing or toxic-appearing.  HENT:     Head: Normocephalic and atraumatic.     Nose: Nose normal. No congestion.  Eyes:     General: No scleral icterus.    Extraocular Movements: Extraocular movements intact.  Cardiovascular:     Rate and Rhythm: Normal rate and  regular rhythm.     Pulses: Normal pulses.     Heart sounds: Normal heart sounds.  Pulmonary:     Effort: Pulmonary effort is normal. No respiratory distress.     Breath sounds: Normal breath sounds.  Abdominal:     General: Abdomen is flat. Bowel sounds are normal.     Palpations: Abdomen is soft.  Musculoskeletal:        General: Normal range of motion.     Right lower leg: 1+ Pitting Edema present.     Left lower leg: 1+ Pitting Edema present.  Lymphadenopathy:     Cervical: No cervical adenopathy.  Skin:    General: Skin is warm and dry.     Findings: Rash present. Rash is macular, papular and vesicular.     Comments: Rash on face around lips and circumferential neck  Neurological:     General: No focal deficit present.     Mental Status: She is alert and oriented to person, place, and time. Mental status is at baseline.  Psychiatric:        Mood and Affect: Mood normal.        Behavior: Behavior normal.        Thought Content: Thought content normal.        Judgment: Judgment normal.     No results found.  No results found for this or any previous visit (from the past 2160 hours).      Garner Nash, MD, MS

## 2023-05-21 NOTE — Patient Instructions (Signed)
 VISIT SUMMARY:  During your visit, we discussed your recent COVID-19 symptoms, a facial rash, and chronic lower leg swelling. We also touched on weight management and planned for a follow-up visit to address these issues further.  YOUR PLAN:  -COVID-19: You have been diagnosed with COVID-19, which is a viral infection that can cause symptoms like fever, chills, body aches, sore throat, and nasal congestion. Continue taking Tessalon Perles and albuterol as prescribed. Use Flonase nasal spray with 2 squirts in each nostril daily. You may consider taking pseudoephedrine (Sudafed) with caution due to potential side effects. Continue using over-the-counter ibuprofen and guaifenesin for cough and muscle aches.  -FACIAL RASH: You have a mobiliform rash on your face, likely related to your COVID-19 infection. This type of rash can be sore to the touch but is not spreading or very itchy. Continue using the cocoa butter stick for relief.  -LOWER LEG SWELLING: You have chronic lower leg swelling, likely due to venous insufficiency, which means the veins in your legs are not working properly to return blood to your heart. We will order an ultrasound to rule out deep vein thrombosis (DVT) and assess venous insufficiency. Consider using compression stockings after you recover from COVID-19.  -WEIGHT MANAGEMENT: We discussed concerns about weight management and plan to address this after your COVID-19 recovery. We will perform baseline lab work and a physical evaluation at your follow-up visit.  INSTRUCTIONS:  Please schedule a follow-up visit in one month. Ensure you fast before your lab work at the follow-up visit. Also, obtain your previous medical records related to your arrhythmia workup.  For more information, you can read your full clinical note, available in your patient portal.

## 2023-05-22 ENCOUNTER — Ambulatory Visit (HOSPITAL_BASED_OUTPATIENT_CLINIC_OR_DEPARTMENT_OTHER): Admission: RE | Admit: 2023-05-22 | Source: Ambulatory Visit

## 2023-05-22 ENCOUNTER — Ambulatory Visit (HOSPITAL_COMMUNITY)
Admission: RE | Admit: 2023-05-22 | Discharge: 2023-05-22 | Disposition: A | Discharge status: Home / Self Care | Source: Ambulatory Visit | Attending: Family Medicine | Admitting: Family Medicine

## 2023-05-22 DIAGNOSIS — I872 Venous insufficiency (chronic) (peripheral): Secondary | ICD-10-CM

## 2023-05-22 NOTE — Addendum Note (Signed)
 Addended by: Garnette Gunner on: 05/22/2023 09:15 AM   Modules accepted: Orders

## 2023-05-22 NOTE — Addendum Note (Signed)
 Addended by: Garnette Gunner on: 05/22/2023 08:39 AM   Modules accepted: Orders

## 2023-05-22 NOTE — Addendum Note (Signed)
 Addended by: Garnette Gunner on: 05/22/2023 08:29 AM   Modules accepted: Orders

## 2023-05-22 NOTE — Progress Notes (Signed)
 Bilateral lower venous duplex done. No DVT or SVT. Prelimiinary report can be found under the CV tab  Dondra Prader RVT RCS

## 2023-05-26 ENCOUNTER — Encounter: Payer: Self-pay | Admitting: Family Medicine

## 2023-06-30 ENCOUNTER — Encounter: Admitting: Family Medicine

## 2023-07-01 ENCOUNTER — Encounter: Admitting: Family Medicine

## 2023-07-14 DIAGNOSIS — H5213 Myopia, bilateral: Secondary | ICD-10-CM | POA: Diagnosis not present

## 2023-07-16 ENCOUNTER — Telehealth: Payer: Self-pay | Admitting: Family Medicine

## 2023-07-16 ENCOUNTER — Encounter: Admitting: Family Medicine

## 2023-07-22 ENCOUNTER — Telehealth: Payer: Self-pay

## 2023-07-22 ENCOUNTER — Ambulatory Visit: Admitting: Family Medicine

## 2023-07-22 ENCOUNTER — Encounter: Payer: Self-pay | Admitting: Family Medicine

## 2023-07-22 ENCOUNTER — Other Ambulatory Visit (HOSPITAL_COMMUNITY): Payer: Self-pay

## 2023-07-22 VITALS — BP 146/94 | HR 84 | Temp 97.0°F | Resp 18 | Wt 317.2 lb

## 2023-07-22 DIAGNOSIS — F331 Major depressive disorder, recurrent, moderate: Secondary | ICD-10-CM

## 2023-07-22 DIAGNOSIS — I872 Venous insufficiency (chronic) (peripheral): Secondary | ICD-10-CM

## 2023-07-22 DIAGNOSIS — I10 Essential (primary) hypertension: Secondary | ICD-10-CM

## 2023-07-22 DIAGNOSIS — Z6841 Body Mass Index (BMI) 40.0 and over, adult: Secondary | ICD-10-CM

## 2023-07-22 DIAGNOSIS — E66813 Obesity, class 3: Secondary | ICD-10-CM | POA: Diagnosis not present

## 2023-07-22 LAB — COMPREHENSIVE METABOLIC PANEL WITH GFR
ALT: 23 U/L (ref 0–35)
AST: 17 U/L (ref 0–37)
Albumin: 4.4 g/dL (ref 3.5–5.2)
Alkaline Phosphatase: 87 U/L (ref 39–117)
BUN: 10 mg/dL (ref 6–23)
CO2: 30 meq/L (ref 19–32)
Calcium: 9.3 mg/dL (ref 8.4–10.5)
Chloride: 102 meq/L (ref 96–112)
Creatinine, Ser: 0.68 mg/dL (ref 0.40–1.20)
GFR: 108.95 mL/min (ref >60.00)
Glucose, Bld: 96 mg/dL (ref 70–99)
Potassium: 4.2 meq/L (ref 3.5–5.1)
Sodium: 137 meq/L (ref 135–145)
Total Bilirubin: 0.3 mg/dL (ref 0.2–1.2)
Total Protein: 7.8 g/dL (ref 6.0–8.3)

## 2023-07-22 LAB — CBC WITH DIFFERENTIAL/PLATELET
Basophils Absolute: 0.1 10*3/uL (ref 0.0–0.1)
Basophils Relative: 1 % (ref 0.0–3.0)
Eosinophils Absolute: 0.4 10*3/uL (ref 0.0–0.7)
Eosinophils Relative: 6.6 % — ABNORMAL HIGH (ref 0.0–5.0)
HCT: 40.5 % (ref 36.0–46.0)
Hemoglobin: 13.5 g/dL (ref 12.0–15.0)
Lymphocytes Relative: 37.5 % (ref 12.0–46.0)
Lymphs Abs: 2.3 10*3/uL (ref 0.7–4.0)
MCHC: 33.4 g/dL (ref 30.0–36.0)
MCV: 89.5 fl (ref 78.0–100.0)
Monocytes Absolute: 0.4 10*3/uL (ref 0.1–1.0)
Monocytes Relative: 7.3 % (ref 3.0–12.0)
Neutro Abs: 2.9 10*3/uL (ref 1.4–7.7)
Neutrophils Relative %: 47.6 % (ref 43.0–77.0)
Platelets: 272 10*3/uL (ref 150.0–400.0)
RBC: 4.52 Mil/uL (ref 3.87–5.11)
RDW: 13.8 % (ref 11.5–15.5)
WBC: 6.1 10*3/uL (ref 4.0–10.5)

## 2023-07-22 LAB — URINALYSIS, ROUTINE W REFLEX MICROSCOPIC
Bilirubin Urine: NEGATIVE
Hgb urine dipstick: NEGATIVE
Ketones, ur: NEGATIVE
Leukocytes,Ua: NEGATIVE
Nitrite: NEGATIVE
Specific Gravity, Urine: 1.025 (ref 1.000–1.030)
Total Protein, Urine: NEGATIVE
Urine Glucose: NEGATIVE
Urobilinogen, UA: 0.2 (ref 0.0–1.0)
pH: 6 (ref 5.0–8.0)

## 2023-07-22 LAB — LIPID PANEL
Cholesterol: 177 mg/dL (ref 0–200)
HDL: 49.5 mg/dL (ref >39.00)
LDL Cholesterol: 108 mg/dL — ABNORMAL HIGH (ref 0–99)
NonHDL: 127.07
Total CHOL/HDL Ratio: 4
Triglycerides: 93 mg/dL (ref 0.0–149.0)
VLDL: 18.6 mg/dL (ref 0.0–40.0)

## 2023-07-22 LAB — MICROALBUMIN / CREATININE URINE RATIO
Creatinine,U: 135.7 mg/dL
Microalb Creat Ratio: 46.4 mg/g — ABNORMAL HIGH (ref 0.0–30.0)
Microalb, Ur: 6.3 mg/dL — ABNORMAL HIGH (ref 0.0–1.9)

## 2023-07-22 LAB — HEMOGLOBIN A1C: Hgb A1c MFr Bld: 6.3 % (ref 4.6–6.5)

## 2023-07-22 LAB — TSH: TSH: 2.14 u[IU]/mL (ref 0.35–5.50)

## 2023-07-22 MED ORDER — SEMAGLUTIDE-WEIGHT MANAGEMENT 1.7 MG/0.75ML ~~LOC~~ SOAJ: 1.7000 mg | SUBCUTANEOUS | 0 refills | Status: DC

## 2023-07-22 MED ORDER — MEDICAL COMPRESSION STOCKINGS MISC
1.0000 | Freq: Every day | 0 refills | Status: AC
Start: 2023-07-22 — End: 2024-07-16

## 2023-07-22 MED ORDER — INDAPAMIDE 2.5 MG PO TABS: 2.5000 mg | ORAL_TABLET | Freq: Every day | ORAL | 0 refills | Status: DC

## 2023-07-22 NOTE — Telephone Encounter (Signed)
 Copied from CRM [id number]. Topic: Referral - Prior Authorization Question >> Jul 22, 2023 10:54 AM Kita Perish H wrote: Reason for CRM: Patient calling in regarding her Semaglutide-Weight Management 1.7 MG/0.75ML SOAJ prescription sent over states she was told by pharmacy request rejected due to needing prior authorization, pharmacy on file.  Nikelle [phone]

## 2023-07-22 NOTE — Telephone Encounter (Signed)
 Hey can we start/submit prior authorization for Semaglutide-Weight Management 1.7 MG/0.75ML. See message below

## 2023-07-22 NOTE — Telephone Encounter (Signed)
 Pharmacy Patient Advocate Encounter   Received notification from Physician's Office that prior authorization for Wegovy 1.7MG /0.75ML auto-injectors is required/requested.   Insurance verification completed.   The patient is insured through [facility] MEDICAID.   Per test claim: PA required; PA submitted to above mentioned insurance via CoverMyMeds Key/confirmation #/EOC (Key: [id number])   Status is pending

## 2023-07-22 NOTE — Progress Notes (Signed)
 Assessment/Plan:     Assessment and Plan Assessment & Plan Hypertension Hypertension with blood pressure consistently in the 140s. Indapamide chosen to manage hypertension and avoid exacerbating leg swelling, unlike amlodipine. Monitoring of kidney function is necessary due to the diuretic's renal effects. - Start indapamide 2.5 mg daily. - Monitor blood pressure daily at home and keep a chart. - Recheck kidney function in one week after starting diuretic.  Venous insufficiency with leg swelling Chronic venous insufficiency with leg swelling, likely exacerbated by prolonged standing at work. Differential diagnosis includes heart failure, liver dysfunction, kidney dysfunction, and thyroid  dysfunction, though these are less likely. Previous ultrasound ruled out blood clots. Non-pharmacologic management includes leg elevation, movement, and compression stockings. Indapamide may assist with both blood pressure and fluid retention. - Order baseline lab work to assess liver, kidney, and thyroid  function. - Recommend use of compression stockings. - Encourage leg elevation and movement during work breaks. - Start indapamide 2.5 mg daily to manage fluid retention.  Obesity with BMI of 44 Obesity with BMI of 44. Discussed weight management options including lifestyle modifications, dietitian referral, and pharmacological interventions. She expressed interest in medical management and potential bariatric surgery. Semaglutide [facility]) for weight loss is covered by Medicaid. She has been using Ozempic (semaglutide) 1 mg with minimal side effects and is interested in continuing with semaglutide for weight management. Discussed potential side effects of semaglutide, including gastrointestinal symptoms, and the importance of starting at a low dose to minimize these effects. - Start semaglutide [facility]) 1.7 mg weekly, titrate as tolerated. - Refer to a dietitian for nutritional counseling. - Discuss  potential referral for bariatric surgery if desired in the future.  Depression Reports mood disturbances and feeling annoyed, attributing mood issues to weight and physical discomfort. No suicidal ideation reported. Discussed the impact of weight and physical health on mood and the importance of addressing these issues to improve overall well-being. - Complete a mood questionnaire to assess current mental health status. - Monitor mood and reassess at follow-up.  Resolved COVID-19 rash Resolved rash following COVID-19. No current symptoms related to COVID-19.      Medications Discontinued During This Encounter  Medication Reason   levalbuterol (XOPENEX HFA) 45 MCG/ACT inhaler     Return in about 1 month (around 08/22/2023) for weight management.    Subjective:   Encounter date: 07/22/2023  Molly Leon is a 43 y.o. female who has COVID-19; Rash associated with COVID-19; Class 3 severe obesity without serious comorbidity with body mass index (BMI) of 40.0 to 44.9 in adult; and Edema of both lower extremities due to peripheral venous insufficiency on their problem list..   She  has no past medical history on file..   She presents with chief complaint of Foot Swelling (Pt c/o of ankle and foot swelling //HM due- TDAP and screening ) and Weight Loss (Pt would like to discuss weight loss options; gastric bypass surgery) .   Discussed the use of AI scribe software for clinical note transcription with the patient, who gave verbal consent to proceed.  History of Present Illness Molly Leon is a 43 year old female who presents for follow-up on leg swelling, rash, and weight management.  She experiences leg swelling, particularly after long shifts at work where she stands for extended periods. An ultrasound of the legs showed no blood clots. Her legs become significantly swollen by the end of her shifts. She has a history of elevated blood pressure, with readings often in the 140s,  and  has not been able to achieve lower readings at home.  She is concerned about her weight and desires to lose weight, often craving sweets. She has tried Ozempic, which she obtained from a friend, and initially experienced nausea but no current side effects.  She experiences ankle pain, which she attributes to being flat-footed. She has previously seen a foot and ankle doctor and was advised to get an MRI, but did not follow through due to scheduling conflicts. She currently manages the pain with over-the-counter medications like Tylenol  or Aleve .  In terms of her mood, she feels 'annoyed all the time' and attributes this to her weight and physical discomfort. No thoughts of self-harm or harm to others.      07/22/2023   10:52 AM 05/21/2023   11:35 AM  Depression screen PHQ 2/9  Decreased Interest 2 3  Down, Depressed, Hopeless 2 2  PHQ - 2 Score 4 5  Altered sleeping 2 3  Tired, decreased energy 2 3  Change in appetite 3 3  Feeling bad or failure about yourself  1 1  Trouble concentrating 1 1  Moving slowly or fidgety/restless 0 0  Suicidal thoughts 0 0  PHQ-9 Score 13 16  Difficult doing work/chores Not difficult at all Somewhat difficult       No past surgical history on file.  Outpatient Medications Prior to Visit  Medication Sig Dispense Refill   fluticasone  (FLONASE ) 50 MCG/ACT nasal spray Place 2 sprays into both nostrils daily. 16 g 6   ibuprofen  (ADVIL ) 600 MG tablet Take 1 tablet (600 mg total) by mouth every 6 (six) hours as needed. 30 tablet 0   levalbuterol (XOPENEX HFA) 45 MCG/ACT inhaler  (Patient not taking: Reported on 07/22/2023)     No facility-administered medications prior to visit.    Family History  Problem Relation Age of Onset   Diabetes Mother    Hypertension Mother    Diabetes Father    Hypertension Father     Social History   Socioeconomic History   Marital status: Single    Spouse name: Not on file   Number of children: Not on file    Years of education: Not on file   Highest education level: 12th grade  Occupational History   Not on file  Tobacco Use   Smoking status: Former    Current packs/day: 0.00    Types: Cigarettes    Quit date: 2023    Years since quitting: 2.3    Passive exposure: Never   Smokeless tobacco: Never  Vaping Use   Vaping status: Never Used  Substance and Sexual Activity   Alcohol use: Yes    Comment: Rar   Drug use: No   Sexual activity: Not on file  Other Topics Concern   Not on file  Social History Narrative   Not on file   Social Drivers of Health   Financial Resource Strain: Medium Risk (07/20/2023)   Overall Financial Resource Strain (CARDIA)    Difficulty of Paying Living Expenses: Somewhat hard  Food Insecurity: Food Insecurity Present (07/20/2023)   Hunger Vital Sign    Worried About Running Out of Food in the Last Year: Often true    Ran Out of Food in the Last Year: Sometimes true  Transportation Needs: Unmet Transportation Needs (07/20/2023)   PRAPARE - Transportation    Lack of Transportation (Medical): Yes    Lack of Transportation (Non-Medical): Yes  Physical Activity: Unknown (07/20/2023)   Exercise Vital Sign  Days of Exercise per Week: 0 days    Minutes of Exercise per Session: Not on file  Stress: Stress Concern Present (07/20/2023)   [employer] of Occupational Health - Occupational Stress Questionnaire    Feeling of Stress : To some extent  Social Connections: Socially Isolated (07/20/2023)   Social Connection and Isolation Panel [NHANES]    Frequency of Communication with Friends and Family: Three times a week    Frequency of Social Gatherings with Friends and Family: Twice a week    Attends Religious Services: Never    Database administrator or Organizations: No    Attends Engineer, structural: Not on file    Marital Status: Never married  Catering manager Violence: Not on file                                                                                                   Objective:  Physical Exam: BP (!) 146/94 (BP Location: Right Arm, Patient Position: Sitting, Cuff Size: Large) Comment: recheck after 10 min resting  Pulse 84   Temp (!) 97 F (36.1 C) (Temporal)   Resp 18   Wt (!) 317 lb 3.2 oz (143.9 kg)   LMP 05/21/2023 (Approximate) Comment: menstrual cycles are abnormal  SpO2 96%   BMI 44.24 kg/m    Physical Exam MEASUREMENTS: BMI- 44.0.   Physical Exam  VAS US  LOWER EXTREMITY VENOUS (DVT) Result Date: 05/22/2023  Lower Venous DVT Study Patient Name:  DETRICE BEHL  Date of Exam:   05/22/2023 Medical Rec #: [id number]        Accession #:    [id number] Date of Birth: May 04, 1980        Patient Gender: F Patient Age:   32 years Exam Location:  [facility] Procedure:      VAS US  LOWER EXTREMITY VENOUS (DVT) Referring Phys: Kasandra Pain --------------------------------------------------------------------------------  Indications: Swelling. Other Indications: COVID +                    Patient complains of bilateral leg swelling for years. She                    denies any pain and no SOB. Risk Factors: None identified. Performing Technologist: Adelia Homestead RVT RCS  Examination Guidelines: A complete evaluation includes B-mode imaging, spectral Doppler, color Doppler, and power Doppler as needed of all accessible portions of each vessel. Bilateral testing is considered an integral part of a complete examination. Limited examinations for reoccurring indications may be performed as noted. The reflux portion of the exam is performed with the patient in reverse Trendelenburg.  +---------+---------------+---------+-----------+----------+--------------+ RIGHT    CompressibilityPhasicitySpontaneityPropertiesThrombus Aging +---------+---------------+---------+-----------+----------+--------------+ CFV      Full           Yes      Yes                                  +---------+---------------+---------+-----------+----------+--------------+ SFJ      Full  Yes      Yes                                 +---------+---------------+---------+-----------+----------+--------------+ FV Prox  Full           Yes      Yes                                 +---------+---------------+---------+-----------+----------+--------------+ FV Mid   Full           Yes      Yes                                 +---------+---------------+---------+-----------+----------+--------------+ FV DistalFull           Yes      Yes                                 +---------+---------------+---------+-----------+----------+--------------+ PFV      Full           Yes      Yes                                 +---------+---------------+---------+-----------+----------+--------------+ POP      Full           Yes      Yes                                 +---------+---------------+---------+-----------+----------+--------------+ PTV      Full           Yes      No                                  +---------+---------------+---------+-----------+----------+--------------+ PERO     Full           Yes      No                                  +---------+---------------+---------+-----------+----------+--------------+ GSV      Full           Yes      Yes                                 +---------+---------------+---------+-----------+----------+--------------+   +---------+---------------+---------+-----------+----------+--------------+ LEFT     CompressibilityPhasicitySpontaneityPropertiesThrombus Aging +---------+---------------+---------+-----------+----------+--------------+ CFV      Full           Yes      Yes                                 +---------+---------------+---------+-----------+----------+--------------+ SFJ      Full           Yes      Yes                                  +---------+---------------+---------+-----------+----------+--------------+  FV Prox  Full           Yes      Yes                                 +---------+---------------+---------+-----------+----------+--------------+ FV Mid   Full           Yes      Yes                                 +---------+---------------+---------+-----------+----------+--------------+ FV DistalFull           Yes      Yes                                 +---------+---------------+---------+-----------+----------+--------------+ PFV      Full           Yes      Yes                                 +---------+---------------+---------+-----------+----------+--------------+ POP      Full           Yes      Yes                                 +---------+---------------+---------+-----------+----------+--------------+ PTV      Full           Yes      No                                  +---------+---------------+---------+-----------+----------+--------------+ PERO     Full           Yes      No                                  +---------+---------------+---------+-----------+----------+--------------+ GSV      Full           Yes      Yes                                 +---------+---------------+---------+-----------+----------+--------------+  Summary: BILATERAL: - No evidence of deep vein thrombosis seen in the lower extremities, bilaterally. - No evidence of superficial venous thrombosis in the lower extremities, bilaterally. -No evidence of popliteal cyst, bilaterally.   *See table(s) above for measurements and observations. Electronically signed by Runell Countryman on 05/22/2023 at 8:37:21 PM.    Final     No results found for this or any previous visit (from the past 2160 hours).      Carnell Christian, MD, MS

## 2023-07-22 NOTE — Patient Instructions (Addendum)
  VISIT SUMMARY: Today, you were seen for follow-up on leg swelling, rash, and weight management. We discussed your leg swelling, which worsens after long shifts at work, and your elevated blood pressure. We also addressed your concerns about weight loss and mood disturbances. Additionally, we reviewed your resolved COVID-19 rash.  YOUR PLAN: -HYPERTENSION: Hypertension means high blood pressure. Your blood pressure has been consistently high, so we are starting you on indapamide 2.5 mg daily to help manage it. Please monitor your blood pressure daily at home and keep a chart. We will recheck your kidney function in one week after starting the medication.  -VENOUS INSUFFICIENCY WITH LEG SWELLING: Venous insufficiency means that the veins in your legs have trouble sending blood back to your heart, causing swelling. To help manage this, we recommend using compression stockings, elevating your legs, and moving around during work breaks. Indapamide will also help with fluid retention. We will do some baseline lab work to check your liver, kidney, and thyroid  function.  -OBESITY WITH BMI OF 44: Obesity means having a body mass index (BMI) of 30 or higher. We discussed weight management options, and you will start semaglutide [facility]) 1.7 mg weekly, which can help with weight loss. We will also refer you to a dietitian for nutritional counseling and discuss the possibility of bariatric surgery in the future if you are interested.  -DEPRESSION: Depression can affect your mood and overall well-being. You reported feeling annoyed and attributed it to your weight and physical discomfort. We will complete a mood questionnaire to assess your current mental health status and monitor your mood at follow-up.  -RESOLVED COVID-19 RASH: Your rash following COVID-19 has resolved, and you have no current symptoms related to COVID-19.  INSTRUCTIONS: Please monitor your blood pressure daily and keep a chart. We will  recheck your kidney function in one week after starting indapamide. Use compression stockings, elevate your legs, and move around during work breaks to help with leg swelling. Start semaglutide [facility]) 1.7 mg weekly for weight loss and follow up with a dietitian for nutritional counseling. Complete the mood questionnaire to assess your mental health status. Follow up with us  as scheduled.

## 2023-07-23 NOTE — Telephone Encounter (Addendum)
 Pharmacy Patient Advocate Encounter  Received notification from [employer] that Prior Authorization for  [facility] 1.7MG /0.75ML auto-injectors has been DENIED.  Full denial letter will be uploaded to the media tab. See denial reason below.  Please see that an addendum to the most recent office encounter would be needed with specific details as noted below,and then we can resubmit an attempt for an approval     PA #/Case ID/Reference #: (Key: [id number])

## 2023-07-23 NOTE — Telephone Encounter (Signed)
 Pharmacy Patient Advocate Encounter  Received notification from [employer] that Prior Authorization for  [facility] 1.7MG /0.75ML auto-injectors has been DENIED.  Full denial letter will be uploaded to the media tab. See denial reason below.  Please see that an addendum to the most recent office encounter would be needed with specific details as noted below,and then we can resubmit an attempt for an approval     PA #/Case ID/Reference #: (Key: [id number])

## 2023-07-25 ENCOUNTER — Ambulatory Visit (INDEPENDENT_AMBULATORY_CARE_PROVIDER_SITE_OTHER): Admitting: Family Medicine

## 2023-07-25 VITALS — BP 150/90 | HR 80 | Temp 98.0°F | Resp 18 | Ht 71.0 in | Wt 317.3 lb

## 2023-07-25 DIAGNOSIS — R7303 Prediabetes: Secondary | ICD-10-CM | POA: Insufficient documentation

## 2023-07-25 DIAGNOSIS — E66813 Obesity, class 3: Secondary | ICD-10-CM

## 2023-07-25 DIAGNOSIS — N182 Chronic kidney disease, stage 2 (mild): Secondary | ICD-10-CM | POA: Diagnosis not present

## 2023-07-25 DIAGNOSIS — Z6841 Body Mass Index (BMI) 40.0 and over, adult: Secondary | ICD-10-CM | POA: Diagnosis not present

## 2023-07-25 DIAGNOSIS — E782 Mixed hyperlipidemia: Secondary | ICD-10-CM

## 2023-07-25 DIAGNOSIS — F331 Major depressive disorder, recurrent, moderate: Secondary | ICD-10-CM | POA: Diagnosis not present

## 2023-07-25 DIAGNOSIS — I10 Essential (primary) hypertension: Secondary | ICD-10-CM

## 2023-07-25 MED ORDER — BUPROPION HCL ER (XL) 150 MG PO TB24
150.0000 mg | ORAL_TABLET | Freq: Every day | ORAL | 0 refills | Status: DC
Start: 2023-07-25 — End: 2023-08-27

## 2023-07-25 NOTE — Progress Notes (Signed)
 Assessment & Plan   Assessment/Plan:    Assessment & Plan Hypertension Blood pressure is slightly elevated today and was mildly elevated at the last visit. No kidney dysfunction on metabolic panel. Mild proteinuria observed, potentially related to hydration status. Monitoring is necessary due to potential impact of hypertension on proteinuria. - Start indapamide  2.5 mg and monitor response. - Check kidney function after starting indapamide .  Proteinuria Mild proteinuria detected on microalbumin creatinine ratio, possibly related to hydration status. Monitoring is necessary, especially in the context of hypertension. - Monitor proteinuria levels in follow-up visits.  Prediabetes Blood sugars are elevated in the prediabetic range. Lifestyle modifications are necessary to prevent progression to diabetes. Semaglutide  was declined by insurance due to lack of documentation of a structured nutritional program. A referral to a nutritionist has been made to address this requirement. - Recommend a Mediterranean-style, reduced-calorie diet with high fruits and vegetables, whole grains, healthy fats, and lean proteins, with a caloric intake of no more than 1500 calories per day. - Encourage drinking 64 ounces of water daily. - Refer to a nutritionist for structured nutritional program. - Resubmit semaglutide  1.7 mg after nutritionist visit.  Obesity She is interested in pursuing a gastric sleeve procedure for weight management. A referral to bariatric surgery will be made. Insurance barriers to semaglutide  were discussed, and steps are being taken to address these. - Refer to bariatric surgery for evaluation for gastric sleeve. - Follow up with nutritionist and resubmit semaglutide  after visit.  Mildly elevated LDL cholesterol LDL cholesterol is mildly elevated at 108 mg/dL. Lifestyle modifications are advised to manage cholesterol levels. - Advise dietary modifications as per the  Mediterranean-style diet recommendation.  Depression She has experienced stress and mood difficulties. Bupropion is considered as it may aid in mood elevation and has potential weight loss benefits. Informed consent was obtained regarding the use of bupropion, including its mechanism of action and the time it takes to become effective. - Prescribe bupropion 150 mg extended-release once daily in the morning.      There are no discontinued medications.  Return in about 2 months (around 09/27/2023) for weight management and mood.        Subjective:   Encounter date: 07/25/2023  Molly Leon is a 43 y.o. 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   She  has no past medical history on file..   She presents with chief complaint of medication managment  (Pt want to discuss alternatives for weight management due to denial RX Semaglutide  1.7MG ) .   Discussed the use of AI scribe software for clinical note transcription with the patient, who gave verbal consent to proceed.  History of Present Illness Molly Leon is a 43 year old female who presents for discussion of alternatives to semaglutide  due to insurance denial.  She is seeking alternatives to semaglutide  after it was declined by her insurance due to lack of documentation of participation in a structured nutritional program.  She engages in physical activity by walking at least  eight hours a day at her workplace but does  not have time for structured exercise. Her dietary efforts have included smaller portion sizes, three meals a day, detox teas, and home remedies.  Her blood pressure has been mildly elevated at recent visits. Recent lab work showed no kidney or liver dysfunction, normal thyroid  levels, and a mild amount of protein in the urine. Her A1c indicates prediabetes, and her LDL cholesterol is mildly elevated at 108 mg/dL.     Review of Systems  Constitutional:  Negative for chills, diaphoresis, fever, malaise/fatigue and weight loss.  HENT:  Negative for congestion, ear discharge, ear pain and hearing loss.   Eyes:  Negative for blurred vision, double vision, photophobia, pain, discharge and redness.  Respiratory:  Negative for cough, sputum production, shortness of breath and wheezing.   Cardiovascular:  Negative for chest pain and palpitations.  Gastrointestinal:  Negative for abdominal pain, blood in stool, constipation, diarrhea, heartburn, melena, nausea and vomiting.  Genitourinary:  Negative for dysuria, flank pain, frequency, hematuria and urgency.  Musculoskeletal:  Negative for myalgias.  Skin:  Negative for itching and rash.  Neurological:  Negative for dizziness, tingling, tremors, speech change, seizures, loss of consciousness, weakness and headaches.  Endo/Heme/Allergies:  Negative for polydipsia.  Psychiatric/Behavioral:  Positive for depression. Negative for hallucinations, memory loss, substance abuse and suicidal ideas. The patient does not have insomnia.   All other systems reviewed and are negative.     07/22/2023   10:52 AM 05/21/2023   11:35 AM  Depression screen PHQ 2/9  Decreased Interest 2 3  Down, Depressed, Hopeless 2 2  PHQ - 2 Score 4 5  Altered sleeping 2 3  Tired, decreased energy 2 3  Change in appetite 3 3  Feeling bad or failure about yourself  1 1  Trouble concentrating 1 1  Moving slowly or fidgety/restless 0 0   Suicidal thoughts 0 0  PHQ-9 Score 13 16  Difficult doing work/chores Not difficult at all Somewhat difficult      07/22/2023   10:52 AM 05/21/2023   11:35 AM  GAD 7 : Generalized Anxiety Score  Nervous, Anxious, on Edge 3 0  Control/stop worrying 2 2  Worry too much - different things 2 2  Trouble relaxing 1 1  Restless 0 0  Easily annoyed or irritable 3 3  Afraid - awful might happen 0 0  Total GAD 7 Score 11 8  Anxiety Difficulty Not difficult at all Not difficult at all     No past surgical history on file.  Outpatient Medications Prior to Visit  Medication Sig Dispense Refill   Elastic Bandages & Supports (MEDICAL COMPRESSION STOCKINGS) MISC 1 each by Does not apply route daily. Grade: Mild. 20 mmHg - 10 mmHg 1 each 0   fluticasone  (FLONASE ) 50 MCG/ACT nasal spray Place 2 sprays into both nostrils daily. 16 g 6   ibuprofen  (ADVIL ) 600 MG tablet Take 1 tablet (600 mg total) by mouth every 6 (six) hours as needed. 30 tablet 0   indapamide  (LOZOL ) 2.5 MG tablet Take 1 tablet (2.5 mg total) by mouth daily. 90 tablet 0   Semaglutide -Weight Management 1.7 MG/0.75ML SOAJ Inject 1.7 mg into the skin once a week. 9 mL 0   No facility-administered medications prior to visit.    Family History  Problem Relation Age of Onset   Diabetes Mother    Hypertension Mother    Diabetes Father    Hypertension Father     Social History   Socioeconomic History   Marital status: Single  Spouse name: Not on file   Number of children: Not on file   Years of education: Not on file   Highest education level: 12th grade  Occupational History   Not on file  Tobacco Use   Smoking status: Former    Current packs/day: 0.00    Types: Cigarettes    Quit date: 2023    Years since quitting: 2.3    Passive exposure: Never   Smokeless tobacco: Never  Vaping Use   Vaping status: Never Used  Substance and Sexual Activity   Alcohol use: Yes    Comment: Rar   Drug use: No   Sexual  activity: Not on file  Other Topics Concern   Not on file  Social History Narrative   Not on file   Social Drivers of Health   Financial Resource Strain: Medium Risk (07/20/2023)   Overall Financial Resource Strain (CARDIA)    Difficulty of Paying Living Expenses: Somewhat hard  Food Insecurity: Food Insecurity Present (07/20/2023)   Hunger Vital Sign    Worried About Running Out of Food in the Last Year: Often true    Ran Out of Food in the Last Year: Sometimes true  Transportation Needs: Unmet Transportation Needs (07/20/2023)   PRAPARE - Administrator, Civil Service (Medical): Yes    Lack of Transportation (Non-Medical): Yes  Physical Activity: Unknown (07/20/2023)   Exercise Vital Sign    Days of Exercise per Week: 0 days    Minutes of Exercise per Session: Not on file  Stress: Stress Concern Present (07/20/2023)   [employer] of Occupational Health - Occupational Stress Questionnaire    Feeling of Stress : To some extent  Social Connections: Socially Isolated (07/20/2023)   Social Connection and Isolation Panel [NHANES]    Frequency of Communication with Friends and Family: Three times a week    Frequency of Social Gatherings with Friends and Family: Twice a week    Attends Religious Services: Never    Database administrator or Organizations: No    Attends Engineer, structural: Not on file    Marital Status: Never married  Catering manager Violence: Not on file                                                                                                  Objective:  Physical Exam: BP (!) 150/90 (BP Location: Left Arm, Patient Position: Sitting, Cuff Size: Large) Comment: recheck done manual  Pulse 80   Temp 98 F (36.7 C) (Temporal)   Resp 18   Ht 5\' 11"  (1.803 m)   Wt (!) 317 lb 4.8 oz (143.9 kg)   LMP 05/21/2023 (Approximate) Comment: menstrual cycles are abnormal  SpO2 97%   BMI 44.25 kg/m      Physical Exam Constitutional:       General: She is not in acute distress.    Appearance: Normal appearance. She is not ill-appearing or toxic-appearing.  HENT:     Head: Normocephalic and atraumatic.     Nose: Nose normal. No congestion.  Eyes:  General: No scleral icterus.    Extraocular Movements: Extraocular movements intact.  Cardiovascular:     Rate and Rhythm: Normal rate and regular rhythm.     Pulses: Normal pulses.     Heart sounds: Normal heart sounds.  Pulmonary:     Effort: Pulmonary effort is normal. No respiratory distress.     Breath sounds: Normal breath sounds.  Abdominal:     General: Abdomen is flat. Bowel sounds are normal.     Palpations: Abdomen is soft.  Musculoskeletal:        General: Normal range of motion.  Lymphadenopathy:     Cervical: No cervical adenopathy.  Skin:    General: Skin is warm and dry.     Findings: No rash.  Neurological:     General: No focal deficit present.     Mental Status: She is alert and oriented to person, place, and time. Mental status is at baseline.  Psychiatric:        Mood and Affect: Mood normal.        Behavior: Behavior normal.        Thought Content: Thought content normal.        Judgment: Judgment normal.     VAS US  LOWER EXTREMITY VENOUS (DVT) Result Date: 05/22/2023  Lower Venous DVT Study Patient Name:  Molly Leon  Date of Exam:   05/22/2023 Medical Rec #: [id number]        Accession #:    [id number] Date of Birth: 20-Jan-1981        Patient Gender: F Patient Age:   43 years Exam Location:  [facility] Procedure:      VAS US  LOWER EXTREMITY VENOUS (DVT) Referring Phys: Kasandra Pain --------------------------------------------------------------------------------  Indications: Swelling. Other Indications: COVID +                    Patient complains of bilateral leg swelling for years. She                    denies any pain and no SOB. Risk Factors: None identified. Performing Technologist: Adelia Homestead RVT RCS  Examination Guidelines: A  complete evaluation includes B-mode imaging, spectral Doppler, color Doppler, and power Doppler as needed of all accessible portions of each vessel. Bilateral testing is considered an integral part of a complete examination. Limited examinations for reoccurring indications may be performed as noted. The reflux portion of the exam is performed with the patient in reverse Trendelenburg.  +---------+---------------+---------+-----------+----------+--------------+ RIGHT    CompressibilityPhasicitySpontaneityPropertiesThrombus Aging +---------+---------------+---------+-----------+----------+--------------+ CFV      Full           Yes      Yes                                 +---------+---------------+---------+-----------+----------+--------------+ SFJ      Full           Yes      Yes                                 +---------+---------------+---------+-----------+----------+--------------+ FV Prox  Full           Yes      Yes                                 +---------+---------------+---------+-----------+----------+--------------+  FV Mid   Full           Yes      Yes                                 +---------+---------------+---------+-----------+----------+--------------+ FV DistalFull           Yes      Yes                                 +---------+---------------+---------+-----------+----------+--------------+ PFV      Full           Yes      Yes                                 +---------+---------------+---------+-----------+----------+--------------+ POP      Full           Yes      Yes                                 +---------+---------------+---------+-----------+----------+--------------+ PTV      Full           Yes      No                                  +---------+---------------+---------+-----------+----------+--------------+ PERO     Full           Yes      No                                   +---------+---------------+---------+-----------+----------+--------------+ GSV      Full           Yes      Yes                                 +---------+---------------+---------+-----------+----------+--------------+   +---------+---------------+---------+-----------+----------+--------------+ LEFT     CompressibilityPhasicitySpontaneityPropertiesThrombus Aging +---------+---------------+---------+-----------+----------+--------------+ CFV      Full           Yes      Yes                                 +---------+---------------+---------+-----------+----------+--------------+ SFJ      Full           Yes      Yes                                 +---------+---------------+---------+-----------+----------+--------------+ FV Prox  Full           Yes      Yes                                 +---------+---------------+---------+-----------+----------+--------------+ FV Mid   Full           Yes      Yes                                 +---------+---------------+---------+-----------+----------+--------------+  FV DistalFull           Yes      Yes                                 +---------+---------------+---------+-----------+----------+--------------+ PFV      Full           Yes      Yes                                 +---------+---------------+---------+-----------+----------+--------------+ POP      Full           Yes      Yes                                 +---------+---------------+---------+-----------+----------+--------------+ PTV      Full           Yes      No                                  +---------+---------------+---------+-----------+----------+--------------+ PERO     Full           Yes      No                                  +---------+---------------+---------+-----------+----------+--------------+ GSV      Full           Yes      Yes                                  +---------+---------------+---------+-----------+----------+--------------+  Summary: BILATERAL: - No evidence of deep vein thrombosis seen in the lower extremities, bilaterally. - No evidence of superficial venous thrombosis in the lower extremities, bilaterally. -No evidence of popliteal cyst, bilaterally.   *See table(s) above for measurements and observations. Electronically signed by Runell Countryman on 05/22/2023 at 8:37:21 PM.    Final     Recent Results (from the past 2160 hours)  TSH     Status: None   Collection Time: 07/22/23 10:26 AM  Result Value Ref Range   TSH 2.14 0.35 - 5.50 uIU/mL  Lipid panel     Status: Abnormal   Collection Time: 07/22/23 10:26 AM  Result Value Ref Range   Cholesterol 177 0 - 200 mg/dL    Comment: ATP III Classification       Desirable:  < 200 mg/dL               Borderline High:  200 - 239 mg/dL          High:  > = 161 mg/dL   Triglycerides 09.6 0.0 - 149.0 mg/dL    Comment: Normal:  <045 mg/dLBorderline High:  150 - 199 mg/dL   HDL 40.98 >11.91 mg/dL   VLDL 47.8 0.0 - 29.5 mg/dL   LDL Cholesterol 621 (H) 0 - 99 mg/dL   Total CHOL/HDL Ratio 4     Comment:                Men  Women1/2 Average Risk     3.4          3.3Average Risk          5.0          4.42X Average Risk          9.6          7.13X Average Risk          15.0          11.0                       NonHDL 127.07     Comment: NOTE:  Non-HDL goal should be 30 mg/dL higher than patient's LDL goal (i.e. LDL goal of < 70 mg/dL, would have non-HDL goal of < 100 mg/dL)  Hemoglobin X9J     Status: None   Collection Time: 07/22/23 10:26 AM  Result Value Ref Range   Hgb A1c MFr Bld 6.3 4.6 - 6.5 %    Comment: Glycemic Control Guidelines for People with Diabetes:Non Diabetic:  <6%Goal of Therapy: <7%Additional Action Suggested:  >8%   Microalbumin / creatinine urine ratio     Status: Abnormal   Collection Time: 07/22/23 10:26 AM  Result Value Ref Range   Microalb, Ur 6.3 (H) 0.0 - 1.9 mg/dL    Creatinine,U 478.2 mg/dL   Microalb Creat Ratio 46.4 (H) 0.0 - 30.0 mg/g  Urinalysis, Routine w reflex microscopic     Status: Abnormal   Collection Time: 07/22/23 10:26 AM  Result Value Ref Range   Color, Urine YELLOW Yellow;Lt. Yellow;Straw;Dark Yellow;Amber;Green;Red;Brown   APPearance Sl Cloudy (A) Clear;Turbid;Slightly Cloudy;Cloudy   Specific Gravity, Urine 1.025 1.000 - 1.030   pH 6.0 5.0 - 8.0   Total Protein, Urine NEGATIVE Negative   Urine Glucose NEGATIVE Negative   Ketones, ur NEGATIVE Negative   Bilirubin Urine NEGATIVE Negative   Hgb urine dipstick NEGATIVE Negative   Urobilinogen, UA 0.2 0.0 - 1.0   Leukocytes,Ua NEGATIVE Negative   Nitrite NEGATIVE Negative   WBC, UA 0-2/hpf 0-2/hpf   Squamous Epithelial / HPF Few(5-10/hpf) (A) Rare(0-4/hpf)   Bacteria, UA Rare(<10/hpf) (A) None   Amorphous Present (A) None;Present  CBC with Differential/Platelet     Status: Abnormal   Collection Time: 07/22/23 10:26 AM  Result Value Ref Range   WBC 6.1 4.0 - 10.5 K/uL   RBC 4.52 3.87 - 5.11 Mil/uL   Hemoglobin 13.5 12.0 - 15.0 g/dL   HCT 95.6 21.3 - 08.6 %   MCV 89.5 78.0 - 100.0 fl   MCHC 33.4 30.0 - 36.0 g/dL   RDW 57.8 46.9 - 62.9 %   Platelets 272.0 150.0 - 400.0 K/uL   Neutrophils Relative % 47.6 43.0 - 77.0 %   Lymphocytes Relative 37.5 12.0 - 46.0 %   Monocytes Relative 7.3 3.0 - 12.0 %   Eosinophils Relative 6.6 (H) 0.0 - 5.0 %   Basophils Relative 1.0 0.0 - 3.0 %   Neutro Abs 2.9 1.4 - 7.7 K/uL   Lymphs Abs 2.3 0.7 - 4.0 K/uL   Monocytes Absolute 0.4 0.1 - 1.0 K/uL   Eosinophils Absolute 0.4 0.0 - 0.7 K/uL   Basophils Absolute 0.1 0.0 - 0.1 K/uL  Comprehensive metabolic panel with GFR     Status: None   Collection Time: 07/22/23 10:26 AM  Result Value Ref Range   Sodium 137 135 - 145 mEq/L   Potassium 4.2 3.5 - 5.1 mEq/L   Chloride  102 96 - 112 mEq/L   CO2 30 19 - 32 mEq/L   Glucose, Bld 96 70 - 99 mg/dL   BUN 10 6 - 23 mg/dL   Creatinine, Ser 1.61 0.40 -  1.20 mg/dL   Total Bilirubin 0.3 0.2 - 1.2 mg/dL   Alkaline Phosphatase 87 39 - 117 U/L   AST 17 0 - 37 U/L   ALT 23 0 - 35 U/L   Total Protein 7.8 6.0 - 8.3 g/dL   Albumin 4.4 3.5 - 5.2 g/dL   GFR 096.04 >54.09 mL/min    Comment: Calculated using the CKD-EPI Creatinine Equation (2021)   Calcium 9.3 8.4 - 10.5 mg/dL        Carnell Christian, MD, MS

## 2023-07-25 NOTE — Patient Instructions (Addendum)
  VISIT SUMMARY: Today, we discussed alternatives to semaglutide  for managing your prediabetes and weight, as your insurance declined coverage. We also reviewed your mildly elevated blood pressure, protein in your urine, prediabetes, obesity, mildly elevated cholesterol, and mood difficulties.  YOUR PLAN: -HYPERTENSION: Your blood pressure is slightly elevated. This means your heart is working harder than normal to pump blood. We will start you on indapamide  2.5 mg and monitor your response. We will also check your kidney function after starting the medication.  -PROTEINURIA: You have a mild amount of protein in your urine, which can be related to your hydration status and blood pressure. We will monitor your protein levels in follow-up visits.  -PREDIABETES: Your blood sugar levels are in the prediabetic range, meaning they are higher than normal but not high enough to be classified as diabetes. We recommend a Mediterranean-style diet with no more than 1500 calories per day, drinking 64 ounces of water daily, and seeing a nutritionist. We will resubmit the request for semaglutide  after your nutritionist visit.  -OBESITY: You are interested in a gastric sleeve procedure for weight management. This is a surgical option to help with weight loss. We will refer you to bariatric surgery for evaluation and follow up with a nutritionist to resubmit the request for semaglutide .  -MILDLY ELEVATED LDL CHOLESTEROL: Your LDL cholesterol is slightly higher than normal. This type of cholesterol can increase your risk of heart disease. We advise following the Mediterranean-style diet to help manage your cholesterol levels.  -DEPRESSION: You have been experiencing stress and mood difficulties. We will start you on bupropion 150 mg extended-release once daily in the morning, which can help improve your mood and may also aid in weight loss.  INSTRUCTIONS: Please follow up with the nutritionist as referred, and we  will resubmit the request for semaglutide  after your visit. Additionally, we will monitor your blood pressure, protein levels in your urine, and kidney function in follow-up visits. You will also be referred to bariatric surgery for evaluation for a gastric sleeve procedure.                               Contains text generated by [employer].

## 2023-07-25 NOTE — Telephone Encounter (Signed)
 Pt have appointment scheduled for today @ 10:20 am

## 2023-08-04 NOTE — Progress Notes (Unsigned)
 errpr

## 2023-08-04 NOTE — Telephone Encounter (Signed)
 07/05/2023 no show 07/16/2023 no show  Pt has been seen now. Sent final warning via mail and mychart.

## 2023-08-08 ENCOUNTER — Telehealth: Payer: Self-pay

## 2023-08-08 NOTE — Telephone Encounter (Signed)
 Faxed referral to [facility] to the attention of Devra Fontana

## 2023-08-08 NOTE — Telephone Encounter (Signed)
 Copied from CRM [id number]. Topic: Referral - Question >> Aug 07, 2023 11:30 AM Adonis Hoot wrote: Reason for CRM: Patient called in stating that [facility] where referral was sent out to on 07/25/2023,is stating that they haven't received referral.They would like to know if it could be refaxed to them. They did state that their fax machine was down,but it is working now. >> Aug 07, 2023 11:42 AM Albertha Alosa wrote: Patient called in to give the office fax number [phone] - if could ATTN: Devra Fontana

## 2023-08-21 ENCOUNTER — Ambulatory Visit: Payer: Self-pay | Admitting: Family Medicine

## 2023-08-21 ENCOUNTER — Ambulatory Visit: Admitting: Neurology

## 2023-08-21 ENCOUNTER — Emergency Department (HOSPITAL_COMMUNITY)

## 2023-08-21 ENCOUNTER — Encounter (HOSPITAL_COMMUNITY): Payer: Self-pay | Admitting: *Deleted

## 2023-08-21 ENCOUNTER — Encounter: Payer: Self-pay | Admitting: Neurology

## 2023-08-21 ENCOUNTER — Emergency Department (HOSPITAL_COMMUNITY)
Admission: EM | Admit: 2023-08-21 | Discharge: 2023-08-22 | Disposition: A | Discharge status: Home / Self Care | Attending: Emergency Medicine | Admitting: Emergency Medicine

## 2023-08-21 ENCOUNTER — Other Ambulatory Visit: Payer: Self-pay

## 2023-08-21 VITALS — BP 128/78 | HR 95 | Ht 71.0 in | Wt 315.0 lb

## 2023-08-21 DIAGNOSIS — R0789 Other chest pain: Secondary | ICD-10-CM | POA: Diagnosis not present

## 2023-08-21 DIAGNOSIS — I872 Venous insufficiency (chronic) (peripheral): Secondary | ICD-10-CM | POA: Diagnosis not present

## 2023-08-21 DIAGNOSIS — N183 Chronic kidney disease, stage 3 unspecified: Secondary | ICD-10-CM | POA: Diagnosis not present

## 2023-08-21 DIAGNOSIS — E669 Obesity, unspecified: Secondary | ICD-10-CM | POA: Insufficient documentation

## 2023-08-21 DIAGNOSIS — R079 Chest pain, unspecified: Secondary | ICD-10-CM | POA: Diagnosis not present

## 2023-08-21 DIAGNOSIS — Z9189 Other specified personal risk factors, not elsewhere classified: Secondary | ICD-10-CM

## 2023-08-21 DIAGNOSIS — R351 Nocturia: Secondary | ICD-10-CM

## 2023-08-21 DIAGNOSIS — G4719 Other hypersomnia: Secondary | ICD-10-CM | POA: Diagnosis not present

## 2023-08-21 DIAGNOSIS — I129 Hypertensive chronic kidney disease with stage 1 through stage 4 chronic kidney disease, or unspecified chronic kidney disease: Secondary | ICD-10-CM | POA: Diagnosis not present

## 2023-08-21 DIAGNOSIS — R0683 Snoring: Secondary | ICD-10-CM | POA: Diagnosis not present

## 2023-08-21 DIAGNOSIS — Z87891 Personal history of nicotine dependence: Secondary | ICD-10-CM | POA: Diagnosis not present

## 2023-08-21 LAB — CBC
HCT: 38 % (ref 36.0–46.0)
Hemoglobin: 13.1 g/dL (ref 12.0–15.0)
MCH: 30.3 pg (ref 26.0–34.0)
MCHC: 34.5 g/dL (ref 30.0–36.0)
MCV: 87.8 fL (ref 80.0–100.0)
Platelets: 254 10*3/uL (ref 150–400)
RBC: 4.33 MIL/uL (ref 3.87–5.11)
RDW: 12.8 % (ref 11.5–15.5)
WBC: 5.8 10*3/uL (ref 4.0–10.5)
nRBC: 0 % (ref 0.0–0.2)

## 2023-08-21 LAB — BASIC METABOLIC PANEL WITH GFR
Anion gap: 8 (ref 5–15)
BUN: 9 mg/dL (ref 6–20)
CO2: 27 mmol/L (ref 22–32)
Calcium: 9 mg/dL (ref 8.9–10.3)
Chloride: 100 mmol/L (ref 98–111)
Creatinine, Ser: 0.89 mg/dL (ref 0.44–1.00)
GFR, Estimated: >60 mL/min (ref >60)
Glucose, Bld: 109 mg/dL — ABNORMAL HIGH (ref 70–99)
Potassium: 3.6 mmol/L (ref 3.5–5.1)
Sodium: 135 mmol/L (ref 135–145)

## 2023-08-21 LAB — TROPONIN I (HIGH SENSITIVITY)
Troponin I (High Sensitivity): <2 ng/L (ref <18)
Troponin I (High Sensitivity): <2 ng/L (ref <18)

## 2023-08-21 LAB — HCG, SERUM, QUALITATIVE: Preg, Serum: NEGATIVE

## 2023-08-21 NOTE — Patient Instructions (Addendum)
 Thank you for choosing [facility] for your sleep related care! It was nice to meet you today!   Here is what we discussed today:   For your chest discomfort I do recommend you go to the emergency room today since you declined calling EMS.  In the least, please call your doctors office today to see if you can be seen today rather than tomorrow.    Based on your symptoms and your exam I believe you are at risk for obstructive sleep apnea (aka OSA). We should proceed with a sleep study to determine whether you do or do not have OSA and how severe it is. Even, if you have mild OSA, I may want you to consider treatment with CPAP, as treatment of even borderline or mild sleep apnea can result and improvement of symptoms such as sleep disruption, daytime sleepiness, nighttime bathroom breaks, restless leg symptoms, improvement of headache syndromes, even improved mood disorder.   As explained, an attended sleep study (meaning you get to stay overnight in the sleep lab), lets us  monitor sleep-related behaviors such as sleep talking and leg movements in sleep, in addition to monitoring for sleep apnea.  A home sleep test is a screening tool for sleep apnea diagnosis only, but unfortunately, does not help with any other sleep-related diagnoses.  Please remember, the long-term risks and ramifications of untreated moderate to severe obstructive sleep apnea may include (but are not limited to): increased risk for cardiovascular disease, including congestive heart failure, stroke, difficult to control hypertension, treatment resistant obesity, arrhythmias, especially irregular heartbeat commonly known as A. Fib. (atrial fibrillation); even type 2 diabetes has been linked to untreated OSA.   Other correlations that untreated obstructive sleep apnea include macular edema which is swelling of the retina in the eyes, droopy eyelid syndrome, and elevated hemoglobin and hematocrit levels (often referred  to as polycythemia).  Sleep apnea can cause disruption of sleep and sleep deprivation in most cases, which, in turn, can cause recurrent headaches, problems with memory, mood, concentration, focus, and vigilance. Most people with untreated sleep apnea report excessive daytime sleepiness, which can affect their ability to drive. Please do not drive or use heavy equipment or machinery, if you feel sleepy! Patients with sleep apnea can also develop difficulty initiating and maintaining sleep (aka insomnia).   Having sleep apnea may increase your risk for other sleep disorders, including involuntary behaviors sleep such as sleep terrors, sleep talking, sleepwalking.    Having sleep apnea can also increase your risk for restless leg syndrome and leg movements at night.   Please note that untreated obstructive sleep apnea may carry additional perioperative morbidity. Patients with significant obstructive sleep apnea (typically, in the moderate to severe degree) should receive, if possible, perioperative PAP (positive airway pressure) therapy and the surgeons and particularly the anesthesiologists should be informed of the diagnosis and the severity of the sleep disordered breathing.   We will call you or email you through MyChart with regards to your test results and plan a follow-up in sleep clinic accordingly. Most likely, you will hear from one of our nurses.   Our sleep lab administrative assistant will call you to schedule your sleep study and give you further instructions, regarding the check in process for the sleep study, arrival time, what to bring, when you can expect to leave after the study, etc., and to answer any other logistical questions you may have. If you don't hear back from her by about 2 weeks from  now, please feel free to call her direct line at [phone] or you can call our general clinic number, or email us  through My Chart.

## 2023-08-21 NOTE — Telephone Encounter (Signed)
 Patient was triaged by Nurse triage and advised to go to the ED.

## 2023-08-21 NOTE — Telephone Encounter (Signed)
 FYI Only or Action Required?: FYI only for provider  Patient was last seen in primary care on 07/25/2023 by Catheryn Cluck, MD. Called Nurse Triage reporting Chest Pain. Symptoms began several days ago. Interventions attempted: Nothing. Symptoms are: gradually worsening.  Triage Disposition: Go to ED Now (Notify PCP)  Patient/caregiver understands and will follow disposition?: Yes                      Copied from CRM [id number]. Topic: Clinical - Red Word Triage >> Aug 21, 2023 12:32 PM Trula Gable C wrote: [facility] that prompted transfer to Nurse Triage: Patient called in stating she is having some chest pain , has an appointment scheduled for tomorrow but wants to see if she can be seen sooner Reason for Disposition  [1] Chest pain (or "angina") comes and goes AND [2] is happening more often (increasing in frequency) or getting worse (increasing in severity)  (Exception: Chest pains that last only a few seconds.)  Answer Assessment - Initial Assessment Questions 1. LOCATION: "Where does it hurt?"       "More in the middle towards the left" 2. RADIATION: "Does the pain go anywhere else?" (e.g., into neck, jaw, arms, back)     Radiates to middle top 3. ONSET: "When did the chest pain begin?" (Minutes, hours or days)      4-5 days 4. PATTERN: "Does the pain come and go, or has it been constant since it started?"  "Does it get worse with exertion?"      Intermittent 5. DURATION: "How long does it last" (e.g., seconds, minutes, hours)     2 hours now 6. SEVERITY: "How bad is the pain?"  (e.g., Scale 1-10; mild, moderate, or severe)    - MILD (1-3): doesn't interfere with normal activities     - MODERATE (4-7): interferes with normal activities or awakens from sleep    - SEVERE (8-10): excruciating pain, unable to do any normal activities       6-7/10 pain level  Protocols used: Chest Pain-A-AH

## 2023-08-21 NOTE — Progress Notes (Signed)
 Subjective:    Patient ID: Molly Leon is a 43 y.o. female.  HPI    Molly Fairy, MD, PhD [facility] [address] [address] [place], [place] [address]  Dear Dr. Hildy Lowers,  I saw your patient, Molly Leon, upon your kind request in my sleep clinic today for initial consultation of her sleep disorder, in particular, concern for underlying obstructive sleep apnea.  The patient is unaccompanied today.  As you know, Ms. [name] is a 43 year old female with an underlying medical history of hypertension, lower extremity edema, venous insufficiency, depression, and severe obesity with a BMI of over 40, who reports some snoring and excessive daytime somnolence.  Her Epworth sleepiness score is 14 out of 24, fatigue severity score is 47 out of 63.  She has had symptoms of tiredness off-and-on for years.  She has been working on weight loss and was able to lose weight some 2 years ago but gained it back.  She was recently denied for Wegovy .  Of note, she has had intermittent chest pain or chest wall pain.  She in fact stated during the appointment that she felt chest discomfort and I offered to call EMS.  She declined.  I advised her to go to the emergency room but she declined and wanted to wait till she can see you tomorrow.  She was advised to call your office today to see if she can be seen today.  She reported that she had chest pain yesterday at work and EMS was called at work, they checked her out and ran an EKG and checked some other things and she was advised to go to the emergency room but declined at the time as well.  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  She lives with her 41 year old twins.  Bedtime is generally between 1:30 AM and 2 AM and rise time between 7:30 AM and 8 AM.  She has nocturia about 3 times per average night and denies recurrent morning headaches or nocturnal headaches.  She is not aware of any  family history of sleep apnea.  They have no pets in the household.  She does have a TV in the bedroom but does not turn it on at night.  She drinks no daily caffeine , she drinks alcohol rarely, she quit smoking some 4 years ago.    Her Past Medical History Is Significant For: Past Medical History:  Diagnosis Date   Chronic kidney disease    pt unaware of details but saw this on her mychart    Her Past Surgical History Is Significant For: Past Surgical History:  Procedure Laterality Date   NO PAST SURGERIES      Her Family History Is Significant For: Family History  Problem Relation Age of Onset   Diabetes Mother    Hypertension Mother    Diabetes Father    Hypertension Father     Her Social History Is Significant For: Social History   Socioeconomic History   Marital status: Single    Spouse name: Not on file   Number of children: Not on file   Years of education: Not on file   Highest education level: 12th grade  Occupational History   Not on file  Tobacco Use   Smoking status: Former    Current packs/day: 0.00    Types: Cigarettes    Quit date: 2023    Years since quitting: 2.4  Passive exposure: Never   Smokeless tobacco: Never  Vaping Use   Vaping status: Never Used  Substance and Sexual Activity   Alcohol use: Yes    Comment: Rare   Drug use: No   Sexual activity: Not on file  Other Topics Concern   Not on file  Social History Narrative   Lives with her 2 children   Right handed   Caffeine : maybe 2 cups/day   Social Drivers of Health   Financial Resource Strain: Medium Risk (07/20/2023)   Overall Financial Resource Strain (CARDIA)    Difficulty of Paying Living Expenses: Somewhat hard  Food Insecurity: Food Insecurity Present (07/20/2023)   Hunger Vital Sign    Worried About Running Out of Food in the Last Year: Often true    Ran Out of Food in the Last Year: Sometimes true  Transportation Needs: Unmet Transportation Needs (07/20/2023)   PRAPARE -  Transportation    Lack of Transportation (Medical): Yes    Lack of Transportation (Non-Medical): Yes  Physical Activity: Unknown (07/20/2023)   Exercise Vital Sign    Days of Exercise per Week: 0 days    Minutes of Exercise per Session: Not on file  Stress: Stress Concern Present (07/20/2023)   [employer] of Occupational Health - Occupational Stress Questionnaire    Feeling of Stress : To some extent  Social Connections: Socially Isolated (07/20/2023)   Social Connection and Isolation Panel [NHANES]    Frequency of Communication with Friends and Family: Three times a week    Frequency of Social Gatherings with Friends and Family: Twice a week    Attends Religious Services: Never    Database administrator or Organizations: No    Attends Engineer, structural: Not on file    Marital Status: Never married    Her Allergies Are:  No Known Allergies:   Her Current Medications Are:  Outpatient Encounter Medications as of 08/21/2023  Medication Sig   buPROPion  (WELLBUTRIN  XL) 150 MG 24 hr tablet Take 1 tablet (150 mg total) by mouth daily.   indapamide  (LOZOL ) 2.5 MG tablet Take 1 tablet (2.5 mg total) by mouth daily.   Elastic Bandages & Supports (MEDICAL COMPRESSION STOCKINGS) MISC 1 each by Does not apply route daily. Grade: Mild. 20 mmHg - 10 mmHg (Patient not taking: Reported on 08/21/2023)   fluticasone  (FLONASE ) 50 MCG/ACT nasal spray Place 2 sprays into both nostrils daily. (Patient not taking: Reported on 08/21/2023)   Semaglutide -Weight Management 1.7 MG/0.75ML SOAJ Inject 1.7 mg into the skin once a week. (Patient not taking: Reported on 08/21/2023)   [DISCONTINUED] hydrochlorothiazide  (HYDRODIURIL ) 25 MG tablet Take 1 tablet (25 mg total) by mouth daily. (Patient not taking: Reported on 02/08/2020)   [DISCONTINUED] ibuprofen  (ADVIL ) 600 MG tablet Take 1 tablet (600 mg total) by mouth every 6 (six) hours as needed.   No facility-administered encounter medications on file as of  08/21/2023.  :   Review of Systems:  Out of a complete 14 point review of systems, all are reviewed and negative with the exception of these symptoms as listed below:  Review of Systems  Neurological:        Patient is here alone for sleep consult. Patient states she feels tired during the day. She denies morning headaches, but believes she snores sometimes. She states she had a sleep study about 2-3 years ago here in [place] but does not recall the facility. Patient unsure if she has a family history of sleep apnea.  ESS 14 FSS 47    Objective:  Neurological Exam  Physical Exam Physical Examination:   Vitals:   08/21/23 0921  BP: 128/78  Pulse: 95    General Examination: The patient is a very pleasant 43 y.o. female in no acute distress. She appears well-developed and well-nourished and well groomed.  No radiating chest pain, no midsternal pain, reports some nonspecific chest discomfort, no diaphoresis, no shortness of breath.  HEENT: Normocephalic, atraumatic, pupils are equal, round and reactive to light, extraocular tracking is good without limitation to gaze excursion or nystagmus noted. Hearing is grossly intact. Face is symmetric with normal facial animation. Speech is clear with no dysarthria noted. There is no hypophonia. There is no lip, neck/head, jaw or voice tremor. Neck is supple with full range of passive and active motion. There are no carotid bruits on auscultation. Oropharynx exam reveals: mild mouth dryness, good dental hygiene and mild airway crowding, due to smaller airway entry, tonsils small, Mallampati is class II. Tongue protrudes centrally and palate elevates symmetrically. Neck size is 16 5/8 inches. She has a minimal overbite.    Chest: Clear to auscultation without wheezing, rhonchi or crackles noted.  Heart: S1+S2+0, regular and normal without murmurs, rubs or gallops noted.   Abdomen: Soft, non-tender and non-distended.  Extremities: There is trace  pitting edema in the distal lower extremities bilaterally.   Skin: Warm and dry without trophic changes noted.   Musculoskeletal: exam reveals no obvious joint deformities.   Neurologically:  Mental status: The patient is awake, alert and oriented in all 4 spheres. Her immediate and remote memory, attention, language skills and fund of knowledge are appropriate. There is no evidence of aphasia, agnosia, apraxia or anomia. Speech is clear with normal prosody and enunciation. Thought process is linear. Mood is normal and affect is normal.  Cranial nerves II - XII are as described above under HEENT exam.  Motor exam: Normal bulk, strength and tone is noted. There is no obvious action or resting tremor.  Fine motor skills and coordination: grossly intact.  Cerebellar testing: No dysmetria or intention tremor. There is no truncal or gait ataxia.  Sensory exam: intact to light touch in the upper and lower extremities.  Gait, station and balance: She stands easily. No veering to one side is noted. No leaning to one side is noted. Posture is age-appropriate and stance is narrow based. Gait shows normal stride length and normal pace. No problems turning are noted.   Assessment and Plan:  In summary, Dejanee Thibeaux is a very pleasant 43 y.o.-year old female with an underlying medical history of hypertension, lower extremity edema, venous insufficiency, depression, and severe obesity with a BMI of over 40, whose history and physical exam are concerning for sleep disordered breathing, particularly obstructive sleep apnea (OSA). A laboratory attended sleep study is typically considered "gold standard" for evaluation of sleep disordered breathing.    For her chest pain, she is advised to go to the emergency room since she declined calling EMS during the clinic visit today.  She also declined going to the ER.  In the least, she was advised to call your office to see if she can be seen today rather than tomorrow,  she does have an appointment for tomorrow.  I had a long chat with the patient about my findings and the diagnosis of sleep apnea, particularly OSA, its prognosis and treatment options. We talked about medical/conservative treatments, surgical interventions and non-pharmacological approaches for symptom control. I explained,  in particular, the risks and ramifications of untreated moderate to severe OSA, especially with respect to developing cardiovascular disease down the road, including congestive heart failure (CHF), difficult to treat hypertension, cardiac arrhythmias (particularly A-fib), neurovascular complications including TIA, stroke and dementia. Even type 2 diabetes has, in part, been linked to untreated OSA. Symptoms of untreated OSA may include (but may not be limited to) daytime sleepiness, nocturia (i.e. frequent nighttime urination), memory problems, mood irritability and suboptimally controlled or worsening mood disorder such as depression and/or anxiety, lack of energy, lack of motivation, physical discomfort, as well as recurrent headaches, especially morning or nocturnal headaches. We talked about the importance of maintaining a healthy lifestyle and striving for healthy weight. In addition, we talked about the importance of striving for and maintaining good sleep hygiene. I recommended a sleep study at this time. I outlined the differences between a laboratory attended sleep study which is considered more comprehensive and accurate over the option of a home sleep test (HST); the latter may lead to underestimation of sleep disordered breathing in some instances and does not help with diagnosing upper airway resistance syndrome and is not accurate enough to diagnose primary central sleep apnea typically. I outlined possible surgical and non-surgical treatment options of OSA, including the use of a positive airway pressure (PAP) device (i.e. CPAP, AutoPAP/APAP or BiPAP in certain circumstances),  a custom-made dental device (aka oral appliance, which would require a referral to a specialist dentist or orthodontist typically, and is generally speaking not considered for patients with full dentures or edentulous state), upper airway surgical options, such as traditional UPPP (which is not considered a first-line treatment) or the Inspire device (hypoglossal nerve stimulator, which would involve a referral for consultation with an ENT surgeon, after careful selection, following inclusion criteria - also not first-line treatment). I explained the PAP treatment option to the patient in detail, as this is generally considered first-line treatment.  The patient indicated that she would be unsure if she would be able to try PAP therapy.  We will pick up our discussion about the next steps and treatment options after testing.  We will keep her posted as to the test results by phone call and/or MyChart messaging where possible.  We will plan to follow-up in sleep clinic accordingly as well.  I answered all her questions today and the patient was in agreement.   I encouraged her to call with any interim questions, concerns, problems or updates or email us  through MyChart.  Generally speaking, sleep test authorizations may take up to 2 weeks, sometimes less, sometimes longer, the patient is encouraged to get in touch with us  if they do not hear back from the sleep lab staff directly within the next 2 weeks.  Thank you very much for allowing me to participate in the care of this nice patient. If I can be of any further assistance to you please do not hesitate to call me at [phone].  Sincerely,   Molly Fairy, MD, PhD

## 2023-08-21 NOTE — ED Triage Notes (Addendum)
 Pt is here for chest pain x 4 days.  Pt is reporting central and left sided chest pain, with pain to back.  Pt states that she feels like it is hard to breathe, no known hx of GERD. Pt states that it feels like something is "stuck" and pain increases with stretching

## 2023-08-22 ENCOUNTER — Encounter: Payer: Self-pay | Admitting: Family Medicine

## 2023-08-22 ENCOUNTER — Ambulatory Visit (INDEPENDENT_AMBULATORY_CARE_PROVIDER_SITE_OTHER): Admitting: Family Medicine

## 2023-08-22 VITALS — BP 126/75 | HR 87 | Temp 98.3°F | Ht 71.0 in | Wt 312.0 lb

## 2023-08-22 DIAGNOSIS — E66813 Obesity, class 3: Secondary | ICD-10-CM

## 2023-08-22 DIAGNOSIS — R079 Chest pain, unspecified: Secondary | ICD-10-CM

## 2023-08-22 DIAGNOSIS — Z6841 Body Mass Index (BMI) 40.0 and over, adult: Secondary | ICD-10-CM

## 2023-08-22 DIAGNOSIS — F331 Major depressive disorder, recurrent, moderate: Secondary | ICD-10-CM | POA: Diagnosis not present

## 2023-08-22 MED ORDER — METHOCARBAMOL 500 MG PO TABS
500.0000 mg | ORAL_TABLET | Freq: Once | ORAL | Status: AC
  Administered 2023-08-22: 500 mg via ORAL
  Filled 2023-08-22: qty 1

## 2023-08-22 MED ORDER — METHOCARBAMOL 500 MG PO TABS: 500.0000 mg | ORAL_TABLET | Freq: Two times a day (BID) | ORAL | 0 refills | Status: AC

## 2023-08-22 MED ORDER — ESOMEPRAZOLE MAGNESIUM 40 MG PO CPDR: 40.0000 mg | DELAYED_RELEASE_CAPSULE | Freq: Every day | ORAL | 3 refills | Status: DC

## 2023-08-22 NOTE — Discharge Instructions (Signed)
 Your cardiac work-up today was normal. Can try the robaxin to see if this helps.  Can continue tylenol /motrin  with this as desired. Follow-up with your doctor in the morning as scheduled. Return here for new concerns.

## 2023-08-22 NOTE — ED Provider Notes (Signed)
 [place] [facility] AT [facility] Provider Note   CSN: [id number] Arrival date & time: 08/21/23  1541     History  Chief Complaint  Patient presents with   Chest Pain    Molly Leon is a 43 y.o. female.  The history is provided by the patient and medical records.  Chest Pain  43 y.o. F with hx of chronic kidney disease, obesity, prediabetes, hypertension, presenting to the ED for chest pain.  Reports this has been intermittent x4 days, random in onset-- sometimes at rest, sometimes with walking.  Usually resolves after some time.  States feels like a mix of pressure and sharp pain in central chest area.  Pain worse with movement, especially stretching towards her left side.  Denies numbness/weakness.  No SOB, palpitations, dizziness, weakness, nausea, vomiting. Mom deceased, not sure about father's cardiac hx but he does have a lot of medical issues.  Quit smoking 4 years ago.  No hx of DVT or PE.  No long distance travel, not on OCP's or other hormone therapy.  Left work today due to pain.    Home Medications Prior to Admission medications   Medication Sig Start Date End Date Taking? Authorizing Provider  buPROPion  (WELLBUTRIN  XL) 150 MG 24 hr tablet Take 1 tablet (150 mg total) by mouth daily. 07/25/23   Catheryn Cluck, MD  Elastic Bandages & Supports (MEDICAL COMPRESSION STOCKINGS) MISC 1 each by Does not apply route daily. Grade: Mild. 20 mmHg - 10 mmHg Patient not taking: Reported on 08/21/2023 07/22/23 07/16/24  Catheryn Cluck, MD  fluticasone  (FLONASE ) 50 MCG/ACT nasal spray Place 2 sprays into both nostrils daily. Patient not taking: Reported on 08/21/2023 05/21/23   Catheryn Cluck, MD  indapamide  (LOZOL ) 2.5 MG tablet Take 1 tablet (2.5 mg total) by mouth daily. 07/22/23 10/20/23  Catheryn Cluck, MD  Semaglutide -Weight Management 1.7 MG/0.75ML SOAJ Inject 1.7 mg into the skin once a week. Patient not taking: Reported on 08/21/2023 07/22/23 10/20/23  Catheryn Cluck, MD  hydrochlorothiazide  (HYDRODIURIL ) 25 MG tablet Take 1 tablet (25 mg total) by mouth daily. Patient not taking: Reported on 02/08/2020 07/18/16 02/08/20  Palumbo, April, MD      Allergies    Patient has no known allergies.    Review of Systems   Review of Systems  Cardiovascular:  Positive for chest pain.  All other systems reviewed and are negative.   Physical Exam Updated Vital Signs BP (!) 149/83   Pulse 91   Temp 98.5 F (36.9 C)   Resp 19   LMP 07/31/2023 (Approximate) Comment: menstrual cycles are abnormal  SpO2 100%   Physical Exam Vitals and nursing note reviewed.  Constitutional:      Appearance: She is well-developed.  HENT:     Head: Normocephalic and atraumatic.  Eyes:     Conjunctiva/sclera: Conjunctivae normal.     Pupils: Pupils are equal, round, and reactive to light.  Cardiovascular:     Rate and Rhythm: Normal rate and regular rhythm.     Heart sounds: Normal heart sounds.  Pulmonary:     Effort: Pulmonary effort is normal.     Breath sounds: Normal breath sounds.     Comments: Lungs CTAB Chest:       Comments: Tenderness to left upper chest wall, no bruising present Abdominal:     General: Bowel sounds are normal.     Palpations: Abdomen is soft.  Musculoskeletal:  General: Normal range of motion.     Cervical back: Normal range of motion.  Skin:    General: Skin is warm and dry.  Neurological:     Mental Status: She is alert and oriented to person, place, and time.     ED Results / Procedures / Treatments   Labs (all labs ordered are listed, but only abnormal results are displayed) Labs Reviewed  BASIC METABOLIC PANEL WITH GFR - Abnormal; Notable for the following components:      Result Value   Glucose, Bld 109 (*)    All other components within normal limits  CBC  HCG, SERUM, QUALITATIVE  TROPONIN I (HIGH SENSITIVITY)  TROPONIN I (HIGH SENSITIVITY)    EKG None  Radiology DG Chest 2 View Result Date:  08/21/2023 CLINICAL DATA:  CP EXAM: CHEST - 2 VIEW COMPARISON:  February 08, 2020 FINDINGS: No focal airspace consolidation, pleural effusion, or pneumothorax. No cardiomegaly. No acute fracture or destructive lesion. IMPRESSION: No acute cardiopulmonary abnormality. Electronically Signed   By: Rance Burrows M.D.   On: 08/21/2023 16:21    Procedures Procedures    Medications Ordered in ED Medications - No data to display  ED Course/ Medical Decision Making/ A&P                                 Medical Decision Making Amount and/or Complexity of Data Reviewed Labs: ordered. Radiology: ordered and independent interpretation performed. ECG/medicine tests: ordered and independent interpretation performed.  Risk Prescription drug management.   43 year old female presenting to the ED with chest pain.  Intermittent x 4 days.  Mostly midsternal in nature without associated shortness of breath, palpitations, dizziness, diaphoresis, nausea, or vomiting.  No prior cardiac history.  EKG is nonischemic.  Labs are reassuring without leukocytosis or electrolyte derangement.  Troponin negative x 2.  Chest x-ray is clear.  Her pain is reproducible on exam with palpation of the chest wall.  Stretching out left arm does seem to exacerbate her pain.  I suspect this is likely musculoskeletal.  She denies any heavy lifting or other strenuous activity recently.  She has no tachycardia or hypoxia here to suggest PE.  No real risk factors for such either.  Symptoms are somewhat atypical.  Doubt ACS, dissection, other acute cardiac event.  Feel she is stable for discharge.  She actually has follow-up scheduled with her PCP in the morning--discussed this visit at her appointment.  May benefit from referral to cardiology if symptoms persist.  Can return here for new concerns.    Final Clinical Impression(s) / ED Diagnoses Final diagnoses:  Chest pain in adult    Rx / DC Orders ED Discharge Orders           Ordered    methocarbamol (ROBAXIN) 500 MG tablet  2 times daily        08/22/23 0116              Coretha Dew, PA-C 08/22/23 1610    Earma Gloss, MD 08/22/23 [phone]

## 2023-08-22 NOTE — Progress Notes (Signed)
 Assessment & Plan   Assessment/Plan:    Assessment & Plan Chest Pain Intermittent chest pain for four days, central location. Negative emergency department workup including troponins, EKG, and chest x-ray. Symptoms include nausea and sensation of obstruction in the chest. Differential diagnosis suggests gastroesophageal reflux disease (GERD) as a potential cause. Cardiac issues ruled out. GERD discussed as a mimic of myocardial infarction symptoms. Proposed trial of esomeprazole as a low-risk, non-invasive management approach. - Prescribe esomeprazole 40 mg to be taken 30 minutes before breakfast - Advise use of antacids like Tums or Maalox for immediate relief if needed - Monitor symptoms and follow up if no improvement in one month  Mood Disturbance Reports feeling annoyed rather than depressed. Currently on bupropion  for mood management. Open to medication adjustment if necessary. Discussed potential dose adjustment if symptoms persist or worsen. - Continue current dose of bupropion  - Consider increasing dose if symptoms persist or worsen  Weight Management Engaged in structured weight management. Previous Wegovy  initiation delayed due to insurance requirements for structured program. Discussed necessity of structured process for insurance approval of Wegovy . - Continue with nutritionist consultations - Coordinate with pharmacy team for prior authorization of Wegovy  - Refer to a different group outside of [facility] for further management  Care Coordination Requires follow-up to assess treatment response and manage ongoing issues. Frustration with referral process and care coordination discussed. Plan to involve care management and pharmacy team for referral and authorization support. - Schedule follow-up in one month if symptoms do not improve - Coordinate with care management and pharmacy team for support with referrals and authorizations      Medications Discontinued During This  Encounter  Medication Reason   Semaglutide -Weight Management 1.7 MG/0.75ML SOAJ    fluticasone  (FLONASE ) 50 MCG/ACT nasal spray             Subjective:   Encounter date: 08/22/2023  Molly Leon is a 43 y.o. 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   She  has a past medical history of Chronic kidney disease..   She presents with chief complaint of Hospitalization Follow-up (Was in ED last night due to chest pain; wasn't diagnosed with anything but did EKG and everything was fine; was given pain meds, not in pain right now) .   Discussed the use of AI scribe software for clinical note transcription with the patient, who gave verbal consent to proceed.  History of Present Illness Molly Leon is a 43 year old female who presents with chest pain.  She has been experiencing intermittent chest pain for the past four days, located in the middle of her chest. The pain lasts about ten minutes before subsiding for approximately twenty minutes and occurs four to five times a day. It is not associated with any specific activity, occurring both when she is sitting and when she is active. She first noticed the pain while sitting in a car and has since experienced it at work, which led to an EMS call. Multiple tests, including troponins, EKG, and chest x-ray, were performed, and no abnormalities  were found.  The pain is not sore to touch, and  there is no recent injury. It is accompanied by nausea, although there is no vomiting. She also reports a sensation of something being stuck in her chest when the pain first started, but she is able to eat and drink without difficulty.  Her current medications include bupropion , which she has been taking for about a month. She feels 'annoyed' rather than depressed and is uncertain about the effectiveness of the medication. No cough, vomiting, diarrhea, or blood in her stool. No SI or HI.       07/22/2023   10:52 AM 05/21/2023   11:35 AM  Depression screen PHQ 2/9  Decreased Interest 2 3  Down, Depressed, Hopeless 2 2  PHQ - 2 Score 4 5  Altered sleeping 2 3  Tired, decreased energy 2 3  Change in appetite 3 3  Feeling bad or failure about yourself  1 1  Trouble concentrating 1 1  Moving slowly or fidgety/restless 0 0  Suicidal thoughts 0 0  PHQ-9 Score 13 16  Difficult doing work/chores Not difficult at all Somewhat difficult      07/22/2023   10:52 AM 05/21/2023   11:35 AM  GAD 7 : Generalized Anxiety Score  Nervous, Anxious, on Edge 3 0  Control/stop worrying 2 2  Worry too much - different things 2 2  Trouble relaxing 1 1  Restless 0 0  Easily annoyed or irritable 3 3  Afraid - awful might happen 0 0  Total GAD 7 Score 11 8  Anxiety Difficulty Not difficult at all Not difficult at all       ROS  Past Surgical History:  Procedure Laterality Date   NO PAST SURGERIES      Outpatient Medications Prior to Visit  Medication Sig Dispense Refill   buPROPion  (WELLBUTRIN  XL) 150 MG 24 hr tablet Take 1 tablet (150 mg total) by mouth daily. 90 tablet 0   Elastic Bandages & Supports (MEDICAL COMPRESSION STOCKINGS) MISC 1 each by Does not apply route daily. Grade: Mild. 20 mmHg - 10 mmHg 1 each 0   indapamide  (LOZOL ) 2.5 MG tablet Take 1 tablet (2.5 mg total) by mouth daily. 90 tablet 0   methocarbamol (ROBAXIN) 500 MG tablet Take 1 tablet (500 mg total) by mouth 2 (two) times  daily. 20 tablet 0   fluticasone  (FLONASE ) 50 MCG/ACT nasal spray Place 2 sprays into both nostrils daily. (Patient not taking: Reported on 08/21/2023) 16 g 6   Semaglutide -Weight Management 1.7 MG/0.75ML SOAJ Inject 1.7 mg into the skin once a week. (Patient not taking: Reported on 08/21/2023) 9 mL 0   No facility-administered medications prior to visit.    Family History  Problem Relation Age of Onset   Diabetes Mother    Hypertension Mother    Diabetes Father    Hypertension Father     Social History   Socioeconomic History   Marital status: Single    Spouse name: Not on file   Number of children: Not on file   Years of education: Not on file   Highest education level: 12th grade  Occupational History   Not on file  Tobacco Use   Smoking status: Former    Current packs/day: 0.00    Types: Cigarettes    Quit date: 2023    Years since quitting: 2.4    Passive exposure: Never   Smokeless tobacco: Never  Vaping Use   Vaping status: Never Used  Substance and Sexual Activity   Alcohol use: Yes    Comment: Rare   Drug use: No   Sexual activity: Not on file  Other Topics Concern   Not on file  Social History Narrative   Lives with her 2 children   Right handed   Caffeine : maybe 2 cups/day   Social Drivers of Health   Financial Resource Strain: Medium Risk (07/20/2023)   Overall Financial Resource Strain (CARDIA)    Difficulty of Paying Living Expenses: Somewhat hard  Food Insecurity: Food Insecurity Present (07/20/2023)   Hunger Vital Sign    Worried About Running Out of Food in the Last Year: Often true    Ran Out of Food in the Last Year: Sometimes true  Transportation Needs: Unmet Transportation Needs (07/20/2023)   PRAPARE - Transportation    Lack of Transportation (Medical): Yes    Lack of Transportation (Non-Medical): Yes  Physical Activity: Unknown (07/20/2023)   Exercise Vital Sign    Days of Exercise per Week: 0 days    Minutes of Exercise per Session: Not on  file  Stress: Stress Concern Present (07/20/2023)   [employer] of Occupational Health - Occupational Stress Questionnaire    Feeling of Stress : To some extent  Social Connections: Socially Isolated (07/20/2023)   Social Connection and Isolation Panel [NHANES]    Frequency of Communication with Friends and Family: Three times a week    Frequency of Social Gatherings with Friends and Family: Twice a week    Attends Religious Services: Never    Database administrator or Organizations: No    Attends Engineer, structural: Not on file    Marital Status: Never married  Catering manager Violence: Not on file                                                                                                  Objective:  Physical Exam: BP 126/75   Pulse 87   Temp 98.3 F (36.8 C)   Ht 5\' 11"  (1.803 m)   Wt (!) 312 lb (141.5 kg)   LMP 07/31/2023 (Approximate) Comment: menstrual cycles are abnormal  SpO2 97%   BMI 43.52 kg/m   Wt Readings from Last 3 Encounters:  08/22/23 (!) 312 lb (141.5 kg)  08/21/23 (!) 315 lb (142.9 kg)  07/25/23 (!) 317 lb 4.8 oz (143.9 kg)    Physical Exam GENERAL: Alert, cooperative, well developed, no acute distress HEENT: Normocephalic, normal oropharynx, moist mucous membranes CHEST: Clear to auscultation bilaterally, no wheezes, rhonchi, or crackles CARDIOVASCULAR: Normal heart rate and rhythm, S1 and S2 normal without murmurs ABDOMEN: Soft, non-tender, non-distended, without organomegaly, normal bowel sounds EXTREMITIES: No cyanosis or edema NEUROLOGICAL: Cranial nerves grossly intact, moves all extremities without gross motor or sensory deficit   Physical Exam  DG Chest 2 View Result Date: 08/21/2023 CLINICAL DATA:  CP EXAM: CHEST - 2 VIEW COMPARISON:  February 08, 2020 FINDINGS: No focal airspace consolidation, pleural effusion, or pneumothorax. No cardiomegaly. No acute fracture or destructive lesion. IMPRESSION: No acute cardiopulmonary  abnormality. Electronically Signed   By:  Rance Burrows M.D.   On: 08/21/2023 16:21    Recent Results (from the past 2160 hours)  TSH     Status: None   Collection Time: 07/22/23 10:26 AM  Result Value Ref Range   TSH 2.14 0.35 - 5.50 uIU/mL  Lipid panel     Status: Abnormal   Collection Time: 07/22/23 10:26 AM  Result Value Ref Range   Cholesterol 177 0 - 200 mg/dL    Comment: ATP III Classification       Desirable:  < 200 mg/dL               Borderline High:  200 - 239 mg/dL          High:  > = 528 mg/dL   Triglycerides 41.3 0.0 - 149.0 mg/dL    Comment: Normal:  <244 mg/dLBorderline High:  150 - 199 mg/dL   HDL 01.02 >72.53 mg/dL   VLDL 66.4 0.0 - 40.3 mg/dL   LDL Cholesterol 474 (H) 0 - 99 mg/dL   Total CHOL/HDL Ratio 4     Comment:                Men          Women1/2 Average Risk     3.4          3.3Average Risk          5.0          4.42X Average Risk          9.6          7.13X Average Risk          15.0          11.0                       NonHDL 127.07     Comment: NOTE:  Non-HDL goal should be 30 mg/dL higher than patient's LDL goal (i.e. LDL goal of < 70 mg/dL, would have non-HDL goal of < 100 mg/dL)  Hemoglobin Q5Z     Status: None   Collection Time: 07/22/23 10:26 AM  Result Value Ref Range   Hgb A1c MFr Bld 6.3 4.6 - 6.5 %    Comment: Glycemic Control Guidelines for People with Diabetes:Non Diabetic:  <6%Goal of Therapy: <7%Additional Action Suggested:  >8%   Microalbumin / creatinine urine ratio     Status: Abnormal   Collection Time: 07/22/23 10:26 AM  Result Value Ref Range   Microalb, Ur 6.3 (H) 0.0 - 1.9 mg/dL   Creatinine,U 563.8 mg/dL   Microalb Creat Ratio 46.4 (H) 0.0 - 30.0 mg/g  Urinalysis, Routine w reflex microscopic     Status: Abnormal   Collection Time: 07/22/23 10:26 AM  Result Value Ref Range   Color, Urine YELLOW Yellow;Lt. Yellow;Straw;Dark Yellow;Amber;Green;Red;Brown   APPearance Sl Cloudy (A) Clear;Turbid;Slightly Cloudy;Cloudy   Specific  Gravity, Urine 1.025 1.000 - 1.030   pH 6.0 5.0 - 8.0   Total Protein, Urine NEGATIVE Negative   Urine Glucose NEGATIVE Negative   Ketones, ur NEGATIVE Negative   Bilirubin Urine NEGATIVE Negative   Hgb urine dipstick NEGATIVE Negative   Urobilinogen, UA 0.2 0.0 - 1.0   Leukocytes,Ua NEGATIVE Negative   Nitrite NEGATIVE Negative   WBC, UA 0-2/hpf 0-2/hpf   Squamous Epithelial / HPF Few(5-10/hpf) (A) Rare(0-4/hpf)   Bacteria, UA Rare(<10/hpf) (A) None   Amorphous Present (A) None;Present  CBC with Differential/Platelet     Status:  Abnormal   Collection Time: 07/22/23 10:26 AM  Result Value Ref Range   WBC 6.1 4.0 - 10.5 K/uL   RBC 4.52 3.87 - 5.11 Mil/uL   Hemoglobin 13.5 12.0 - 15.0 g/dL   HCT 95.6 21.3 - 08.6 %   MCV 89.5 78.0 - 100.0 fl   MCHC 33.4 30.0 - 36.0 g/dL   RDW 57.8 46.9 - 62.9 %   Platelets 272.0 150.0 - 400.0 K/uL   Neutrophils Relative % 47.6 43.0 - 77.0 %   Lymphocytes Relative 37.5 12.0 - 46.0 %   Monocytes Relative 7.3 3.0 - 12.0 %   Eosinophils Relative 6.6 (H) 0.0 - 5.0 %   Basophils Relative 1.0 0.0 - 3.0 %   Neutro Abs 2.9 1.4 - 7.7 K/uL   Lymphs Abs 2.3 0.7 - 4.0 K/uL   Monocytes Absolute 0.4 0.1 - 1.0 K/uL   Eosinophils Absolute 0.4 0.0 - 0.7 K/uL   Basophils Absolute 0.1 0.0 - 0.1 K/uL  Comprehensive metabolic panel with GFR     Status: None   Collection Time: 07/22/23 10:26 AM  Result Value Ref Range   Sodium 137 135 - 145 mEq/L   Potassium 4.2 3.5 - 5.1 mEq/L   Chloride 102 96 - 112 mEq/L   CO2 30 19 - 32 mEq/L   Glucose, Bld 96 70 - 99 mg/dL   BUN 10 6 - 23 mg/dL   Creatinine, Ser 5.28 0.40 - 1.20 mg/dL   Total Bilirubin 0.3 0.2 - 1.2 mg/dL   Alkaline Phosphatase 87 39 - 117 U/L   AST 17 0 - 37 U/L   ALT 23 0 - 35 U/L   Total Protein 7.8 6.0 - 8.3 g/dL   Albumin 4.4 3.5 - 5.2 g/dL   GFR 413.24 >40.10 mL/min    Comment: Calculated using the CKD-EPI Creatinine Equation (2021)   Calcium 9.3 8.4 - 10.5 mg/dL  Basic metabolic panel      Status: Abnormal   Collection Time: 08/21/23  3:56 PM  Result Value Ref Range   Sodium 135 135 - 145 mmol/L   Potassium 3.6 3.5 - 5.1 mmol/L   Chloride 100 98 - 111 mmol/L   CO2 27 22 - 32 mmol/L   Glucose, Bld 109 (H) 70 - 99 mg/dL    Comment: Glucose reference range applies only to samples taken after fasting for at least 8 hours.   BUN 9 6 - 20 mg/dL   Creatinine, Ser 2.72 0.44 - 1.00 mg/dL   Calcium 9.0 8.9 - 53.6 mg/dL   GFR, Estimated >64 >40 mL/min    Comment: (NOTE) Calculated using the CKD-EPI Creatinine Equation (2021)    Anion gap 8 5 - 15    Comment: Performed at [facility], [address] [place], [place] [address]  CBC     Status: None   Collection Time: 08/21/23  3:56 PM  Result Value Ref Range   WBC 5.8 4.0 - 10.5 K/uL   RBC 4.33 3.87 - 5.11 MIL/uL   Hemoglobin 13.1 12.0 - 15.0 g/dL   HCT 59.5 63.8 - 75.6 %   MCV 87.8 80.0 - 100.0 fL   MCH 30.3 26.0 - 34.0 pg   MCHC 34.5 30.0 - 36.0 g/dL   RDW 43.3 29.5 - 18.8 %   Platelets 254 150 - 400 K/uL   nRBC 0.0 0.0 - 0.2 %    Comment: Performed at [facility], [address], [place], [place] [address]  Troponin I (High Sensitivity)     Status: None   Collection Time: 08/21/23  3:56 PM  Result Value Ref Range   Troponin I (High Sensitivity) <2 <18 ng/L    Comment: (NOTE) Elevated high sensitivity troponin I (hsTnI) values and significant  changes across serial measurements may suggest ACS but many other  chronic and acute conditions are known to elevate hsTnI results.  Refer to the "Links" section for chest pain algorithms and additional  guidance. Performed at [facility], [address], [place], [place] [address]   hCG, serum, qualitative     Status: None   Collection Time: 08/21/23  3:56 PM  Result Value Ref Range   Preg, Serum NEGATIVE NEGATIVE    Comment:        THE SENSITIVITY OF THIS METHODOLOGY IS >10 mIU/mL. Performed at [facility], [address], [place],  [place] [address]   Troponin I (High Sensitivity)     Status: None   Collection Time: 08/21/23  5:56 PM  Result Value Ref Range   Troponin I (High Sensitivity) <2 <18 ng/L    Comment: (NOTE) Elevated high sensitivity troponin I (hsTnI) values and significant  changes across serial measurements may suggest ACS but many other  chronic and acute conditions are known to elevate hsTnI results.  Refer to the "Links" section for chest pain algorithms and additional  guidance. Performed at [facility], [address] [place], [place] [address]         Carnell Christian, MD, MS

## 2023-08-25 ENCOUNTER — Telehealth: Payer: Self-pay | Admitting: Neurology

## 2023-08-25 NOTE — Telephone Encounter (Signed)
 NPSG M[facility] pending

## 2023-08-26 NOTE — Telephone Encounter (Signed)
 NPSG MCD [facility] [place]: [id number] (exp. 08/25/23 to 10/16/23)

## 2023-08-27 ENCOUNTER — Other Ambulatory Visit: Payer: Self-pay | Admitting: Family Medicine

## 2023-08-27 DIAGNOSIS — F331 Major depressive disorder, recurrent, moderate: Secondary | ICD-10-CM

## 2023-08-27 MED ORDER — BUPROPION HCL ER (XL) 300 MG PO TB24: 300.0000 mg | ORAL_TABLET | Freq: Every day | ORAL | 0 refills | Status: DC

## 2023-08-27 NOTE — Telephone Encounter (Signed)
 Copied from CRM [id number]. Topic: Clinical - Medication Question >> Aug 26, 2023  4:30 PM Aisha D wrote: Reason for CRM: Pt stated that she had a recent visit with Dr.Thompson and he asked if she was ok with doing a increase on the buPROPion  (WELLBUTRIN  XL) 150 MG 24 hr tablet. Pt stated that she wants to inform Dr.Thompson that she wants to move forward with the increase and also receive a callback with an update.

## 2023-08-28 ENCOUNTER — Ambulatory Visit: Admitting: Dietician

## 2023-09-01 NOTE — Telephone Encounter (Signed)
 Spoke with the patient.   NPSG M[facility] [place]: [id number] (exp. 08/25/23 to 10/16/23)   Patient is scheduled at [facility] for 09/17/23 at 9 pm.  Mailed packet & sent mychart

## 2023-09-08 ENCOUNTER — Telehealth: Payer: Self-pay

## 2023-09-08 NOTE — Progress Notes (Signed)
 Complex Care Management Note  Care Guide Note 09/08/2023 Name: Molly Leon MRN: [id number] DOB: 28-Nov-1980  Molly Leon is a 43 y.o. year old female who sees Sebastian Beverley NOVAK, MD for primary care. I reached out to Leslee Forte by phone today to offer complex care management services.  [name] was given information about Complex Care Management services today including:   The Complex Care Management services include support from the care team which includes your Nurse Care Manager, Clinical Social Worker, or Pharmacist.  The Complex Care Management team is here to help remove barriers to the health concerns and goals most important to you. Complex Care Management services are voluntary, and the patient may decline or stop services at any time by request to their care team member.   Complex Care Management Consent Status: Patient agreed to services and verbal consent obtained.   Follow up plan:  Telephone appointment with complex care management team member scheduled for:  09/12/23 at 9:00 a.m.  Encounter Outcome:  Patient Scheduled  Dreama [facility]  [facility], [facility] Care Management Assistant Direct Dial: [phone]  Fax: [phone]

## 2023-09-12 ENCOUNTER — Telehealth: Payer: Self-pay

## 2023-09-15 ENCOUNTER — Ambulatory Visit: Admitting: Dietician

## 2023-09-17 ENCOUNTER — Encounter

## 2023-09-24 ENCOUNTER — Ambulatory Visit: Admitting: Family Medicine

## 2023-09-29 ENCOUNTER — Other Ambulatory Visit: Payer: Self-pay | Admitting: Medical Genetics

## 2023-09-29 ENCOUNTER — Encounter: Payer: Self-pay | Admitting: Neurology

## 2023-09-30 ENCOUNTER — Telehealth: Payer: Self-pay

## 2023-09-30 NOTE — Progress Notes (Signed)
 Complex Care Management Care Guide Note  09/30/2023 Name: Chessica Audia MRN: [id number] DOB: 09/09/1980  Kate Larock is a 43 y.o. year old female who is a primary care patient of Sebastian Beverley NOVAK, MD and is actively engaged with the care management team. I reached out to Leslee Forte by phone today to assist with re-scheduling  with the RN Case Manager.  Follow up plan: Telephone appointment with complex care management team member scheduled for:  10/01/23 at 2:00 p.m.   Dreama [facility]  [facility], [facility] Care Management Assistant Direct Dial: [phone]  Fax: [phone]

## 2023-10-01 ENCOUNTER — Other Ambulatory Visit: Payer: Self-pay | Admitting: *Deleted

## 2023-10-01 NOTE — Patient Outreach (Signed)
 Complex Care Management   Visit Note  10/01/2023  Name:  Molly Leon MRN: [id number] DOB: March 20, 1980  Situation: Referral received for Complex Care Management related to Education and Disease State Monitoring I obtained verbal consent from Parent.  Visit completed with patient  on the phone  Background:   Past Medical History:  Diagnosis Date   Chronic kidney disease    pt unaware of details but saw this on her mychart    Assessment: Patient Reported Symptoms:  Cognitive Cognitive Status: Alert and oriented to person, place, and time, Normal speech and language skills   Health Maintenance Behaviors: Immunizations Healing Pattern: Slow  Neurological Neurological Review of Symptoms: No symptoms reported Neurological Management Strategies: Medication therapy, Routine screening Neurological Self-Management Outcome: 4 (good)  HEENT HEENT Symptoms Reported: Ear dryness (Will present this issues to provider on 10/02/2023 office visit) HEENT Management Strategies: Medication therapy, Routine screening    Cardiovascular Cardiovascular Symptoms Reported: Swelling in legs or feet (Patient states she elevated her legs when needed. educated on Compressions stocks) Does patient have uncontrolled Hypertension?: No Cardiovascular Management Strategies: Routine screening, Medication therapy Weight: (!) 320 lb (145.2 kg) (Patient reports on home device)  Respiratory Respiratory Symptoms Reported: No symptoms reported    Endocrine Endocrine Symptoms Reported: No symptoms reported Is patient diabetic?: No    Gastrointestinal Gastrointestinal Symptoms Reported: No symptoms reported      Genitourinary Genitourinary Symptoms Reported: No symptoms reported    Integumentary Integumentary Symptoms Reported: Itching, Rash (Patient reports itching with rash in patch formation that is very dry spreading around back area. Stongly encouraged pt to discussed with her provider on office scheduled  10/02/2023.)    Musculoskeletal Musculoskelatal Symptoms Reviewed: No symptoms reported        Psychosocial Psychosocial Symptoms Reported: No symptoms reported   Major Change/Loss/Stressor/Fears (CP): Denies Techniques to Cope with Loss/Stress/Change: None Quality of Family Relationships: supportive Do you feel physically threatened by others?: No     There were no vitals filed for this visit.  Medications Reviewed Today     Reviewed by Alvia Olam BIRCH, RN (Registered Nurse) on 10/01/23 at 1425  Med List Status: <None>   Medication Order Taking? Sig Documenting Provider Last Dose Status Informant  buPROPion  (WELLBUTRIN  XL) 300 MG 24 hr tablet [id number] Yes Take 1 tablet (300 mg total) by mouth daily. Sebastian Beverley NOVAK, MD  Active   Elastic Bandages & Supports (MEDICAL COMPRESSION STOCKINGS) [place] [id number]  1 each by Does not apply route daily. Grade: Mild. 20 mmHg - 10 mmHg  Patient not taking: Reported on 10/01/2023   Sebastian Beverley NOVAK, MD  Active   esomeprazole  (NEXIUM ) 40 MG capsule [id number] Yes Take 1 capsule (40 mg total) by mouth daily. Sebastian Beverley NOVAK, MD  Active    Patient not taking:   Discontinued 02/08/20 1704 indapamide  (LOZOL ) 2.5 MG tablet [id number] Yes Take 1 tablet (2.5 mg total) by mouth daily. Sebastian Beverley NOVAK, MD  Active   methocarbamol  (ROBAXIN ) 500 MG tablet [id number]  Take 1 tablet (500 mg total) by mouth 2 (two) times daily.  Patient not taking: Reported on 10/01/2023   Jarold Olam HERO, PA-C  Active   Med List Note Giacomo Camelia BIRCH, CPhT 07/17/16 2340): No pharmacy            Recommendation:   PCP Follow-up on 10/02/2023. Patient   Follow Up Plan:   Telephone follow up appointment date/time:  10/17/2023 @ 3:00 PM  Olam Alvia, RN, BSN [place]  [facility], [facility] RN Care Manager Direct Dial: [phone]  Fax: [phone]

## 2023-10-01 NOTE — Patient Instructions (Signed)
 Visit Information  [name] was given information about Medicaid Managed Care team care coordination services as a part of their [facility] Medicaid benefit. Molly Leon verbally consented to engagement with the [facility] Managed Care team.   If you are experiencing a medical emergency, please call 911 or report to your local emergency department or urgent care.   If you have a non-emergency medical problem during routine business hours, please contact your provider's office and ask to speak with a nurse.   For questions related to your [facility], please call: [phone] or visit the homepage here: [email or web address]  If you would like to schedule transportation through your [facility], please call the following number at least 2 days in advance of your appointment: [phone]   Rides for urgent appointments can also be made after hours by calling Member Services.  Call the Behavioral Health Crisis Line at [phone], at any time, 24 hours a day, 7 days a week. If you are in danger or need immediate medical attention call 911.  If you would like help to quit smoking, call 1-800-QUIT-NOW (470-305-7022) OR Espaol: 1-855-Djelo-Ya (8-144-664-6430) o para ms informacin haga clic aqu or Text READY to 799-599 to register via text  [name] - following are the goals we discussed in your visit today:   Goals Addressed             This Visit's Progress    [facility] RN Care Plan       Problems:  Chronic Disease Management support and education needs related to prediabetes.  Goal: Over the next 90 days the patient will attend all scheduled medical appointments: discussed as evidenced by chart reviewed        verbalize understanding of plan for management of  weight loss as evidenced by increase mobility with exercises and healthy eating  habits.   Interventions:   Weight Loss Interventions: Provided verbal and/or written education to patient re: provider recommended life style modifications  Reviewed recommended dietary changes: avoid fad diets, make small/incremental dietary and exercise changes, eat at the table and avoid eating in front of the TV, plan management of cravings, monitor snacking and cravings in food diary Encouraged lifestyle modifications with nutritional counseling on healthy eating habits, physical activity promotion with three days weekly increase ambulation 10-15 minutes walks.   Encouraged behavioral changes related to setting goals, self monitoring, stress management, coping skills and limiting emotional stress eating.  Patient Self-Care Activities:  Attend all scheduled provider appointments  Plan:  Telephone follow up appointment with care management team member scheduled for:  10/17/2023 @ 3:00 PM             Please see education materials related to prediabetes provided by MyChart link.  Patient verbalizes understanding of instructions and care plan provided today and agrees to view in MyChart. Active MyChart status and patient understanding of how to access instructions and care plan via MyChart confirmed with patient.     Telephone follow up appointment with Managed Medicaid care management team member scheduled for: 10/17/2023 @ 3:00 PM  Olam Ku, RN, BSN [place]  [facility], [facility] RN Care Manager Direct Dial: [phone]  Fax: [phone]    Following is a copy of your plan of care:  There are no care plans that you recently modified to display for this patient.

## 2023-10-02 ENCOUNTER — Ambulatory Visit (INDEPENDENT_AMBULATORY_CARE_PROVIDER_SITE_OTHER): Admitting: Family Medicine

## 2023-10-02 ENCOUNTER — Encounter: Payer: Self-pay | Admitting: Family Medicine

## 2023-10-02 VITALS — BP 132/88 | HR 77 | Temp 96.5°F | Ht 71.0 in | Wt 321.0 lb

## 2023-10-02 DIAGNOSIS — F331 Major depressive disorder, recurrent, moderate: Secondary | ICD-10-CM

## 2023-10-02 DIAGNOSIS — E66813 Obesity, class 3: Secondary | ICD-10-CM

## 2023-10-02 DIAGNOSIS — Z6841 Body Mass Index (BMI) 40.0 and over, adult: Secondary | ICD-10-CM | POA: Diagnosis not present

## 2023-10-02 DIAGNOSIS — B360 Pityriasis versicolor: Secondary | ICD-10-CM

## 2023-10-02 DIAGNOSIS — K219 Gastro-esophageal reflux disease without esophagitis: Secondary | ICD-10-CM | POA: Diagnosis not present

## 2023-10-02 DIAGNOSIS — R7303 Prediabetes: Secondary | ICD-10-CM | POA: Diagnosis not present

## 2023-10-02 DIAGNOSIS — I10 Essential (primary) hypertension: Secondary | ICD-10-CM | POA: Diagnosis not present

## 2023-10-02 DIAGNOSIS — B354 Tinea corporis: Secondary | ICD-10-CM

## 2023-10-02 MED ORDER — FLUCONAZOLE 150 MG PO TABS: 150.0000 mg | ORAL_TABLET | ORAL | 0 refills | Status: AC

## 2023-10-02 MED ORDER — BUPROPION HCL ER (XL) 150 MG PO TB24: 150.0000 mg | ORAL_TABLET | Freq: Every day | ORAL | 3 refills | Status: DC

## 2023-10-02 MED ORDER — NALTREXONE HCL 50 MG PO TABS: 50.0000 mg | ORAL_TABLET | Freq: Every day | ORAL | 3 refills | Status: DC

## 2023-10-02 NOTE — Patient Instructions (Signed)
 VISIT SUMMARY:  Today, we discussed your chest discomfort, weight management, mood stabilization, and a rash on your back. We reviewed your current medications and made some adjustments to better manage your symptoms and overall health.  YOUR PLAN:  -TINEA CORPORIS: Tinea Corporis is a fungal infection of the skin, causing an itchy rash. Given the extent and location of your rash, we will treat it with an oral medication. You are prescribed fluconazole  150 mg to be taken twice weekly for 30 days.  -GASTROESOPHAGEAL REFLUX DISEASE (GERD): GERD is a condition where stomach acid frequently flows back into the tube connecting your mouth and stomach, causing discomfort. Continue taking esomeprazole  as needed to manage your chest discomfort.  -DEPRESSION: Depression is a mood disorder that causes persistent feelings of sadness and loss of interest. You are currently taking bupropion , which helps with mood stabilization. We discussed the possibility of adding naltrexone  to your regimen to help with both mood and weight management. Continue with bupropion  150 mg and adjust the dosage as needed.  -PRE-DIABETES: Pre-diabetes means your blood sugar levels are higher than normal but not yet high enough to be diagnosed as diabetes. You are engaged in a structured weight management program to help you lose weight and potentially qualify for Wegovy . Continue with the program and we will reassess the need for Wegovy  after 90 days.  INSTRUCTIONS:  Please follow up in 90 days to reassess your weight management progress and the need for Wegovy . Continue taking your medications as prescribed and adhere to the structured weight management program. If you experience any new or worsening symptoms, contact our office.

## 2023-10-02 NOTE — Progress Notes (Signed)
 Assessment & Plan   Assessment/Plan:        Tinea Corporis Extensive itchy rash on the back, present for several months, characterized by large, dark papules. Topical treatment is impractical due to location and extent. Oral fluconazole  is preferred. - Prescribe fluconazole  150 mg twice weekly for 30 days.  Gastroesophageal Reflux Disease (GERD) Intermittent chest discomfort attributed to GERD. Symptoms improved with esomeprazole , though adherence is inconsistent. Reports less frequent pain compared to initial presentation. - Continue esomeprazole  as needed for chest discomfort.  Depression On bupropion  for mood stabilization. Reports improved clarity of thoughts and reduced overthinking, but experiences difficulty with consistent dosing. Feels 300 mg may be excessive when taken consecutively. Discussed potential addition of naltrexone  for mood and weight management until Wegovy  is approved. - Prescribe bupropion  150 mg with option to adjust dosage. - Consider adding naltrexone  to bupropion  regimen.  Pre-diabetes Obesity management Engaged in a structured weight management program as part of Medicaid requirements to qualify for Wegovy . Program involves lifestyle coaching and a goal to lose two pounds in two weeks. Medicaid initially denied Wegovy  due to lack of a documented structured plan. Resubmission for Wegovy  approval will be considered upon completion of the 90-day program. - Continue with structured weight management program. - Reassess need for Wegovy  after 90-day program.          Medications Discontinued During This Encounter  Medication Reason   buPROPion  (WELLBUTRIN  XL) 300 MG 24 hr tablet     Return in about 2 months (around 12/03/2023) for mood, weight management, rash.        Subjective:   Encounter date: 10/02/2023  Molly Leon is a 43 y.o. 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   She  has a past medical history of Chronic kidney disease..   She presents with chief complaint of Chest Pain (Follow up, no concerns) .  Discussed the use of AI scribe software for clinical note transcription with the patient, who gave verbal consent to proceed.  History of Present Illness   Molly Leon is a 43 year old female who presents with chest discomfort and follow-up on weight management.  She experiences chest discomfort, previously thought to be related to gastroesophageal reflux disease (GERD). She was prescribed esomeprazole , which she used for two weeks, noting improvement in her symptoms. However, she has not been consistent with the medication, but her chest pain occurs less frequently than before.  She is addressing weight management issues and has been in contact with a nutritionist who recommended increasing physical activity. She reports that the nutritionist told her she is pre-diabetic and recommended that she work on losing weight, with a goal of losing two pounds in two weeks. She is in the process of obtaining assistance for Wegovy , a weight management medication, but her insurance requires a structured plan over a set period before approval.  She is currently taking bupropion  for mood stabilization. The medication helps her think more clearly and reduces overthinking. However, she sometimes misses doses and notices a difference  when she  does. She alternates between 150 mg and 300 mg doses, finding the higher dose occasionally too much if taken consecutively. She describes the medication as helping to 'calm my thoughts' and 'clear the chatter in her mind'.  She has a rash on her back, which has been present for a couple of months. Initially small, it has spread to cover her entire back and is described as itchy and raw. Her son took a picture of it, and she notes it is not scaly but consists of large, dark papules. The rash has been persistent since her last visit.  She mentions experiencing stress related to her job, which she believes has contributed to her difficulty sleeping and overall stress levels. She is attempting to manage this stress through her job and notes that the situation has been ongoing since May.         10/01/2023    7:22 PM 07/22/2023   10:52 AM 05/21/2023   11:35 AM  Depression screen PHQ 2/9  Decreased Interest 0 2 3  Down, Depressed, Hopeless 0 2 2  PHQ - 2 Score 0 4 5  Altered sleeping  2 3  Tired, decreased energy  2 3  Change in appetite  3 3  Feeling bad or failure about yourself   1 1  Trouble concentrating  1 1  Moving slowly or fidgety/restless  0 0  Suicidal thoughts  0 0  PHQ-9 Score  13 16  Difficult doing work/chores  Not difficult at all Somewhat difficult      07/22/2023   10:52 AM 05/21/2023   11:35 AM  GAD 7 : Generalized Anxiety Score  Nervous, Anxious, on Edge 3 0  Control/stop worrying 2 2  Worry too much - different things 2 2  Trouble relaxing 1 1  Restless 0 0  Easily annoyed or irritable 3 3  Afraid - awful might happen 0 0  Total GAD 7 Score 11 8  Anxiety Difficulty Not difficult at all Not difficult at all       ROS  Past Surgical History:  Procedure Laterality Date   NO PAST SURGERIES      Outpatient Medications Prior to Visit  Medication Sig Dispense Refill   esomeprazole  (NEXIUM ) 40 MG capsule Take 1 capsule (40 mg total) by mouth daily. 30 capsule 3    indapamide  (LOZOL ) 2.5 MG tablet Take 1 tablet (2.5 mg total) by mouth daily. 90 tablet 0   buPROPion  (WELLBUTRIN  XL) 300 MG 24 hr tablet Take 1 tablet (300 mg total) by mouth daily. 90 tablet 0   Elastic Bandages & Supports (MEDICAL COMPRESSION STOCKINGS) MISC 1 each by Does not apply route daily. Grade: Mild. 20 mmHg - 10 mmHg (Patient not taking: Reported on 10/02/2023) 1 each 0   methocarbamol  (ROBAXIN ) 500 MG tablet Take 1 tablet (500 mg total) by mouth 2 (two) times daily. (Patient not taking: Reported on 10/02/2023) 20 tablet 0   No facility-administered medications prior to visit.    Family History  Problem Relation Age of Onset   Diabetes Mother    Hypertension Mother    Diabetes Father    Hypertension Father     Social History   Socioeconomic History   Marital status: Single    Spouse name: Not on file   Number of children: Not on file   Years of education: Not on file   Highest education level: 12th grade  Occupational History   Not on file  Tobacco  Use   Smoking status: Former    Current packs/day: 0.00    Types: Cigarettes    Quit date: 2023    Years since quitting: 2.5    Passive exposure: Never   Smokeless tobacco: Never  Vaping Use   Vaping status: Never Used  Substance and Sexual Activity   Alcohol use: Yes    Comment: Rare   Drug use: No   Sexual activity: Not on file  Other Topics Concern   Not on file  Social History Narrative   Lives with her 2 children   Right handed   Caffeine : maybe 2 cups/day   Social Drivers of Corporate investment banker Strain: Low Risk  (10/02/2023)   Overall Financial Resource Strain (CARDIA)    Difficulty of Paying Living Expenses: Not very hard  Recent Concern: Financial Resource Strain - Medium Risk (07/20/2023)   Overall Financial Resource Strain (CARDIA)    Difficulty of Paying Living Expenses: Somewhat hard  Food Insecurity: Food Insecurity Present (10/02/2023)   Hunger Vital Sign    Worried About Running Out of  Food in the Last Year: Sometimes true    Ran Out of Food in the Last Year: Sometimes true  Transportation Needs: No Transportation Needs (10/02/2023)   PRAPARE - Administrator, Civil Service (Medical): No    Lack of Transportation (Non-Medical): No  Recent Concern: Transportation Needs - Unmet Transportation Needs (07/20/2023)   PRAPARE - Transportation    Lack of Transportation (Medical): Yes    Lack of Transportation (Non-Medical): Yes  Physical Activity: Inactive (10/02/2023)   Exercise Vital Sign    Days of Exercise per Week: 0 days    Minutes of Exercise per Session: Not on file  Stress: No Stress Concern Present (10/02/2023)   [employer] of Occupational Health - Occupational Stress Questionnaire    Feeling of Stress: Only a little  Recent Concern: Stress - Stress Concern Present (07/20/2023)   [employer] of Occupational Health - Occupational Stress Questionnaire    Feeling of Stress : To some extent  Social Connections: Socially Isolated (10/02/2023)   Social Connection and Isolation Panel    Frequency of Communication with Friends and Family: Twice a week    Frequency of Social Gatherings with Friends and Family: Once a week    Attends Religious Services: Never    Database administrator or Organizations: No    Attends Engineer, structural: Not on file    Marital Status: Never married  Intimate Partner Violence: Not At Risk (10/01/2023)   Humiliation, Afraid, Rape, and Kick questionnaire    Fear of Current or Ex-Partner: No    Emotionally Abused: No    Physically Abused: No    Sexually Abused: No                                                                                                  Objective:  Physical Exam: BP 132/88 (BP Location: Left Arm, Patient Position: Sitting, Cuff Size: Large)   Pulse 77   Temp (!) 96.5 F (35.8 C)  Ht 5' 11 (1.803 m)   Wt (!) 321 lb (145.6 kg)   SpO2 98%   BMI 44.77 kg/m   Wt Readings from Last 3  Encounters:  10/02/23 (!) 321 lb (145.6 kg)  10/01/23 (!) 320 lb (145.2 kg)  08/22/23 (!) 312 lb (141.5 kg)    Physical Exam   Physical Exam   VITALS: BP- 132/88 GENERAL: Alert, cooperative, well developed, no acute distress HEENT: Normocephalic, normal oropharynx, moist mucous membranes CHEST: Clear to auscultation bilaterally, no wheezes, rhonchi, or crackles CARDIOVASCULAR: Normal heart rate and rhythm, S1 and S2 normal without murmurs ABDOMEN: Soft, non-tender, non-distended, without organomegaly, normal bowel sounds EXTREMITIES: No cyanosis or edema NEUROLOGICAL: Cranial nerves grossly intact, moves all extremities without gross motor or sensory deficit SKIN: hyperpigmented, confluent, multi-macule patch rash over the thoracic back    DG Chest 2 View Result Date: 08/21/2023 CLINICAL DATA:  CP EXAM: CHEST - 2 VIEW COMPARISON:  February 08, 2020 FINDINGS: No focal airspace consolidation, pleural effusion, or pneumothorax. No cardiomegaly. No acute fracture or destructive lesion. IMPRESSION: No acute cardiopulmonary abnormality. Electronically Signed   By: Rogelia Myers M.D.   On: 08/21/2023 16:21    Recent Results (from the past 2160 hours)  TSH     Status: None   Collection Time: 07/22/23 10:26 AM  Result Value Ref Range   TSH 2.14 0.35 - 5.50 uIU/mL  Lipid panel     Status: Abnormal   Collection Time: 07/22/23 10:26 AM  Result Value Ref Range   Cholesterol 177 0 - 200 mg/dL    Comment: ATP III Classification       Desirable:  < 200 mg/dL               Borderline High:  200 - 239 mg/dL          High:  > = 759 mg/dL   Triglycerides 06.9 0.0 - 149.0 mg/dL    Comment: Normal:  <849 mg/dLBorderline High:  150 - 199 mg/dL   HDL 50.49 >60.99 mg/dL   VLDL 81.3 0.0 - 59.9 mg/dL   LDL Cholesterol 891 (H) 0 - 99 mg/dL   Total CHOL/HDL Ratio 4     Comment:                Men          Women1/2 Average Risk     3.4          3.3Average Risk          5.0          4.42X Average Risk           9.6          7.13X Average Risk          15.0          11.0                       NonHDL 127.07     Comment: NOTE:  Non-HDL goal should be 30 mg/dL higher than patient's LDL goal (i.e. LDL goal of < 70 mg/dL, would have non-HDL goal of < 100 mg/dL)  Hemoglobin J8r     Status: None   Collection Time: 07/22/23 10:26 AM  Result Value Ref Range   Hgb A1c MFr Bld 6.3 4.6 - 6.5 %    Comment: Glycemic Control Guidelines for People with Diabetes:Non Diabetic:  <6%Goal of Therapy: <7%Additional Action Suggested:  >8%  Microalbumin / creatinine urine ratio     Status: Abnormal   Collection Time: 07/22/23 10:26 AM  Result Value Ref Range   Microalb, Ur 6.3 (H) 0.0 - 1.9 mg/dL   Creatinine,U 864.2 mg/dL   Microalb Creat Ratio 46.4 (H) 0.0 - 30.0 mg/g  Urinalysis, Routine w reflex microscopic     Status: Abnormal   Collection Time: 07/22/23 10:26 AM  Result Value Ref Range   Color, Urine YELLOW Yellow;Lt. Yellow;Straw;Dark Yellow;Amber;Green;Red;Brown   APPearance Sl Cloudy (A) Clear;Turbid;Slightly Cloudy;Cloudy   Specific Gravity, Urine 1.025 1.000 - 1.030   pH 6.0 5.0 - 8.0   Total Protein, Urine NEGATIVE Negative   Urine Glucose NEGATIVE Negative   Ketones, ur NEGATIVE Negative   Bilirubin Urine NEGATIVE Negative   Hgb urine dipstick NEGATIVE Negative   Urobilinogen, UA 0.2 0.0 - 1.0   Leukocytes,Ua NEGATIVE Negative   Nitrite NEGATIVE Negative   WBC, UA 0-2/hpf 0-2/hpf   Squamous Epithelial / HPF Few(5-10/hpf) (A) Rare(0-4/hpf)   Bacteria, UA Rare(<10/hpf) (A) None   Amorphous Present (A) None;Present  CBC with Differential/Platelet     Status: Abnormal   Collection Time: 07/22/23 10:26 AM  Result Value Ref Range   WBC 6.1 4.0 - 10.5 K/uL   RBC 4.52 3.87 - 5.11 Mil/uL   Hemoglobin 13.5 12.0 - 15.0 g/dL   HCT 59.4 63.9 - 53.9 %   MCV 89.5 78.0 - 100.0 fl   MCHC 33.4 30.0 - 36.0 g/dL   RDW 86.1 88.4 - 84.4 %   Platelets 272.0 150.0 - 400.0 K/uL   Neutrophils Relative % 47.6  43.0 - 77.0 %   Lymphocytes Relative 37.5 12.0 - 46.0 %   Monocytes Relative 7.3 3.0 - 12.0 %   Eosinophils Relative 6.6 (H) 0.0 - 5.0 %   Basophils Relative 1.0 0.0 - 3.0 %   Neutro Abs 2.9 1.4 - 7.7 K/uL   Lymphs Abs 2.3 0.7 - 4.0 K/uL   Monocytes Absolute 0.4 0.1 - 1.0 K/uL   Eosinophils Absolute 0.4 0.0 - 0.7 K/uL   Basophils Absolute 0.1 0.0 - 0.1 K/uL  Comprehensive metabolic panel with GFR     Status: None   Collection Time: 07/22/23 10:26 AM  Result Value Ref Range   Sodium 137 135 - 145 mEq/L   Potassium 4.2 3.5 - 5.1 mEq/L   Chloride 102 96 - 112 mEq/L   CO2 30 19 - 32 mEq/L   Glucose, Bld 96 70 - 99 mg/dL   BUN 10 6 - 23 mg/dL   Creatinine, Ser 9.31 0.40 - 1.20 mg/dL   Total Bilirubin 0.3 0.2 - 1.2 mg/dL   Alkaline Phosphatase 87 39 - 117 U/L   AST 17 0 - 37 U/L   ALT 23 0 - 35 U/L   Total Protein 7.8 6.0 - 8.3 g/dL   Albumin 4.4 3.5 - 5.2 g/dL   GFR 891.04 >39.99 mL/min    Comment: Calculated using the CKD-EPI Creatinine Equation (2021)   Calcium 9.3 8.4 - 10.5 mg/dL  Basic metabolic panel     Status: Abnormal   Collection Time: 08/21/23  3:56 PM  Result Value Ref Range   Sodium 135 135 - 145 mmol/L   Potassium 3.6 3.5 - 5.1 mmol/L   Chloride 100 98 - 111 mmol/L   CO2 27 22 - 32 mmol/L   Glucose, Bld 109 (H) 70 - 99 mg/dL    Comment: Glucose reference range applies only to samples taken after fasting for  at least 8 hours.   BUN 9 6 - 20 mg/dL   Creatinine, Ser 9.10 0.44 - 1.00 mg/dL   Calcium 9.0 8.9 - 89.6 mg/dL   GFR, Estimated >39 >39 mL/min    Comment: (NOTE) Calculated using the CKD-EPI Creatinine Equation (2021)    Anion gap 8 5 - 15    Comment: Performed at [facility], [address] [place], [place] [address]  CBC     Status: None   Collection Time: 08/21/23  3:56 PM  Result Value Ref Range   WBC 5.8 4.0 - 10.5 K/uL   RBC 4.33 3.87 - 5.11 MIL/uL   Hemoglobin 13.1 12.0 - 15.0 g/dL   HCT 61.9 63.9 - 53.9 %   MCV 87.8 80.0 - 100.0 fL    MCH 30.3 26.0 - 34.0 pg   MCHC 34.5 30.0 - 36.0 g/dL   RDW 87.1 88.4 - 84.4 %   Platelets 254 150 - 400 K/uL   nRBC 0.0 0.0 - 0.2 %    Comment: Performed at [facility], [address] [place], [place] [address]  Troponin I (High Sensitivity)     Status: None   Collection Time: 08/21/23  3:56 PM  Result Value Ref Range   Troponin I (High Sensitivity) <2 <18 ng/L    Comment: (NOTE) Elevated high sensitivity troponin I (hsTnI) values and significant  changes across serial measurements may suggest ACS but many other  chronic and acute conditions are known to elevate hsTnI results.  Refer to the Links section for chest pain algorithms and additional  guidance. Performed at [facility], [address], [place], [place] [address]   hCG, serum, qualitative     Status: None   Collection Time: 08/21/23  3:56 PM  Result Value Ref Range   Preg, Serum NEGATIVE NEGATIVE    Comment:        THE SENSITIVITY OF THIS METHODOLOGY IS >10 mIU/mL. Performed at [facility], [address], [place], [place] [address]   Troponin I (High Sensitivity)     Status: None   Collection Time: 08/21/23  5:56 PM  Result Value Ref Range   Troponin I (High Sensitivity) <2 <18 ng/L    Comment: (NOTE) Elevated high sensitivity troponin I (hsTnI) values and significant  changes across serial measurements may suggest ACS but many other  chronic and acute conditions are known to elevate hsTnI results.  Refer to the Links section for chest pain algorithms and additional  guidance. Performed at [facility], [address], [place], [place] [address]         Beverley Adine Hummer, MD, MS

## 2023-10-04 DIAGNOSIS — B360 Pityriasis versicolor: Secondary | ICD-10-CM | POA: Insufficient documentation

## 2023-10-15 ENCOUNTER — Ambulatory Visit (HOSPITAL_COMMUNITY)
Admission: RE | Admit: 2023-10-15 | Discharge: 2023-10-15 | Disposition: A | Discharge status: Home / Self Care | Source: Ambulatory Visit | Attending: Cardiovascular Disease | Admitting: Cardiovascular Disease

## 2023-10-15 DIAGNOSIS — I342 Nonrheumatic mitral (valve) stenosis: Secondary | ICD-10-CM

## 2023-10-15 DIAGNOSIS — I872 Venous insufficiency (chronic) (peripheral): Secondary | ICD-10-CM | POA: Insufficient documentation

## 2023-10-15 DIAGNOSIS — Z6841 Body Mass Index (BMI) 40.0 and over, adult: Secondary | ICD-10-CM | POA: Insufficient documentation

## 2023-10-15 DIAGNOSIS — E66813 Obesity, class 3: Secondary | ICD-10-CM | POA: Diagnosis not present

## 2023-10-15 LAB — ECHOCARDIOGRAM COMPLETE
Area-P 1/2: 4.83 cm2
S' Lateral: 2.8 cm

## 2023-10-17 ENCOUNTER — Other Ambulatory Visit: Payer: Self-pay | Admitting: *Deleted

## 2023-10-17 NOTE — Patient Outreach (Signed)
 Complex Care Management   Visit Note  10/17/2023  Name:  Molly Leon MRN: [id number] DOB: [birth year]  Situation: Referral received for Complex Care Management related to prediabetes/weight loss. I obtained verbal consent from Patient.  Visit completed with patient  on the phone  Background:   Past Medical History:  Diagnosis Date   Chronic kidney disease    pt unaware of details but saw this on her mychart    Assessment: Patient Reported Symptoms:  Cognitive Cognitive Status: Alert and oriented to person, place, and time, Able to follow simple commands, Normal speech and language skills      Neurological Neurological Review of Symptoms: No symptoms reported    HEENT HEENT Symptoms Reported: No symptoms reported      Cardiovascular Cardiovascular Symptoms Reported: Swelling in legs or feet (Much  improved for previous assessment. Continues to elevated bilateral LE) Does patient have uncontrolled Hypertension?: No Weight: (!) 321 lb (145.6 kg)  Respiratory Respiratory Symptoms Reported: No symptoms reported    Endocrine Endocrine Symptoms Reported: No symptoms reported Is patient diabetic?: No    Gastrointestinal Gastrointestinal Symptoms Reported: No symptoms reported      Genitourinary Genitourinary Symptoms Reported: No symptoms reported    Integumentary Integumentary Symptoms Reported: Rash (Patient reports improved) Additional Integumentary Details: Patient states she now has a prescription medication now with i=some improvement. Skin Management Strategies: Routine screening, Medication therapy Skin Self-Management Outcome: 4 (good)  Musculoskeletal Musculoskelatal Symptoms Reviewed: No symptoms reported        Psychosocial Psychosocial Symptoms Reported: No symptoms reported            10/17/2023    3:23 PM  Depression screen PHQ 2/9  Decreased Interest 0  Down, Depressed, Hopeless 0  PHQ - 2 Score 0    There were no vitals filed for this  visit.  Medications Reviewed Today     Reviewed by Alvia Olam BIRCH, RN (Registered Nurse) on 10/17/23 at 1518  Med List Status: <None>   Medication Order Taking? Sig Documenting Provider Last Dose Status Informant  buPROPion  (WELLBUTRIN  XL) 150 MG 24 hr tablet [id number] Yes Take 1 tablet (150 mg total) by mouth daily. Sebastian Beverley NOVAK, MD  Active   Elastic Bandages & Supports (MEDICAL COMPRESSION STOCKINGS) [place] [id number]  1 each by Does not apply route daily. Grade: Mild. 20 mmHg - 10 mmHg  Patient not taking: Reported on 10/02/2023   Sebastian Beverley NOVAK, MD  Active   esomeprazole  (NEXIUM ) 40 MG capsule [id number] Yes Take 1 capsule (40 mg total) by mouth daily. Sebastian Beverley NOVAK, MD  Active   fluconazole  (DIFLUCAN ) 150 MG tablet [id number] Yes Take 1 tablet (150 mg total) by mouth 2 (two) times a week. Sebastian Beverley NOVAK, MD  Active    Patient not taking:   Discontinued 02/08/20 1704 indapamide  (LOZOL ) 2.5 MG tablet [id number] Yes Take 1 tablet (2.5 mg total) by mouth daily. Sebastian Beverley NOVAK, MD  Active   methocarbamol  (ROBAXIN ) 500 MG tablet [id number]  Take 1 tablet (500 mg total) by mouth 2 (two) times daily.  Patient not taking: Reported on 10/02/2023   Jarold Olam HERO, PA-C  Active   naltrexone  (DEPADE) 50 MG tablet [id number] Yes Take 1 tablet (50 mg total) by mouth daily. Sebastian Beverley NOVAK, MD  Active   Med List Note Giacomo Camelia BIRCH, CPhT 07/17/16 2340): No pharmacy            Recommendation:   PCP Follow-up Continue Current Plan of Care  Follow Up Plan:   Telephone follow up appointment date/time:  11/18/2023@ 3 pm   Olam Ku, RN, BSN [place]  [facility], [facility] RN Care Manager Direct Dial: [phone]  Fax: [phone]

## 2023-10-17 NOTE — Patient Instructions (Signed)
 Visit Information  Thank you for taking time to visit with me today. Please don't hesitate to contact me if I can be of assistance to you before our next scheduled appointment.  Your next care management appointment is by telephone on 11/18/2023 at 3 pm  Please call the care guide team at [phone] if you need to cancel, schedule, or reschedule an appointment.   Please call the Suicide and Crisis Lifeline: 988 call the [place]  National Suicide Prevention Lifeline: 701-683-0222 or TTY: 310-624-5567 TTY (541)549-0226) to talk to a trained counselor call 1-800-273-TALK (toll free, 24 hour hotline) if you are experiencing a Mental Health or Behavioral Health Crisis or need someone to talk to.   Olam Ku, RN, BSN [place]  [facility], [facility] RN Care Manager Direct Dial: [phone]  Fax: [phone]

## 2023-10-20 ENCOUNTER — Other Ambulatory Visit: Payer: Self-pay | Admitting: Family Medicine

## 2023-10-20 DIAGNOSIS — I10 Essential (primary) hypertension: Secondary | ICD-10-CM

## 2023-10-22 ENCOUNTER — Ambulatory Visit: Payer: Self-pay | Admitting: Family Medicine

## 2023-10-22 DIAGNOSIS — Z6841 Body Mass Index (BMI) 40.0 and over, adult: Secondary | ICD-10-CM

## 2023-11-18 ENCOUNTER — Other Ambulatory Visit: Payer: Self-pay | Admitting: *Deleted

## 2023-11-18 NOTE — Patient Outreach (Signed)
 Complex Care Management   Visit Note  11/18/2023  Name:  Molly Leon MRN: [id number] DOB: [birth year]  Situation: Referral received for Complex Care Management related to Pre-diabetes and weight loss. I obtained verbal consent from Patient.  Visit completed with Patient  on the phone  Background:   Past Medical History:  Diagnosis Date   Chronic kidney disease    pt unaware of details but saw this on her mychart    Assessment: Patient reports pending consult with specialist concerning other others for possible medication to assist with weight loss before consideration of surgical procedure. Patient Reported Symptoms:  Cognitive Cognitive Status: Alert and oriented to person, place, and time, Able to follow simple commands, Normal speech and language skills   Health Maintenance Behaviors: Annual physical exam  Neurological Neurological Review of Symptoms: No symptoms reported    HEENT HEENT Symptoms Reported: No symptoms reported      Cardiovascular Cardiovascular Symptoms Reported: No symptoms reported Does patient have uncontrolled Hypertension?: No Cardiovascular Management Strategies: Medication therapy Weight: (!) 321 lb (145.6 kg)  Respiratory Respiratory Symptoms Reported: No symptoms reported Respiratory Management Strategies: Routine screening  Endocrine Endocrine Symptoms Reported: No symptoms reported Is patient diabetic?: No    Gastrointestinal Gastrointestinal Symptoms Reported: No symptoms reported      Genitourinary Genitourinary Symptoms Reported: No symptoms reported    Integumentary Integumentary Symptoms Reported: No symptoms reported    Musculoskeletal Musculoskelatal Symptoms Reviewed: No symptoms reported        Psychosocial Psychosocial Symptoms Reported: No symptoms reported     Do you feel physically threatened by others?: No     There were no vitals filed for this visit.  Medications Reviewed Today     Reviewed by Alvia Olam BIRCH,  RN (Registered Nurse) on 11/18/23 at 1509  Med List Status: <None>   Medication Order Taking? Sig Documenting Provider Last Dose Status Informant  buPROPion  (WELLBUTRIN  XL) 150 MG 24 hr tablet [id number] Yes Take 1 tablet (150 mg total) by mouth daily. Sebastian Beverley NOVAK, MD  Active   Elastic Bandages & Supports (MEDICAL COMPRESSION STOCKINGS) [place] [id number]  1 each by Does not apply route daily. Grade: Mild. 20 mmHg - 10 mmHg  Patient not taking: Reported on 10/02/2023   Sebastian Beverley NOVAK, MD  Active   esomeprazole  (NEXIUM ) 40 MG capsule [id number] Yes Take 1 capsule (40 mg total) by mouth daily. Sebastian Beverley NOVAK, MD  Active    Patient not taking:   Discontinued 02/08/20 1704 indapamide  (LOZOL ) 2.5 MG tablet [id number] Yes TAKE 1 TABLET(2.5 MG) BY MOUTH DAILY Sebastian Beverley NOVAK, MD  Active   methocarbamol  (ROBAXIN ) 500 MG tablet [id number]  Take 1 tablet (500 mg total) by mouth 2 (two) times daily.  Patient not taking: Reported on 10/02/2023   Jarold Olam HERO, PA-C  Active   naltrexone  (DEPADE) 50 MG tablet [id number] Yes Take 1 tablet (50 mg total) by mouth daily. Sebastian Beverley NOVAK, MD  Active   Med List Note Giacomo Camelia BIRCH, CPhT 07/17/16 2340): No pharmacy            Recommendation:   PCP Follow-up Specialty provider follow-up 12/08/2023 sd reported by patient Continue Current Plan of Care  Follow Up Plan:   Telephone follow up appointment date/time:  12/18/2023 @3 :30 pm  Olam Alvia, RN, BSN [place]  [facility], [facility] RN Care Manager Direct Dial: [phone]  Fax: [phone]

## 2023-11-18 NOTE — Patient Instructions (Signed)
 Visit Information  Thank you for taking time to visit with me today. Please don't hesitate to contact me if I can be of assistance to you before our next scheduled appointment.  Your next care management appointment is by telephone on 12/18/2023 at 3:30 pm  Please call the care guide team at [phone] if you need to cancel, schedule, or reschedule an appointment.   Please call the Suicide and Crisis Lifeline: 988 call the [place]  National Suicide Prevention Lifeline: 401-285-8743 or TTY: 680-478-9389 TTY 505-102-8449) to talk to a trained counselor call 1-800-273-TALK (toll free, 24 hour hotline) if you are experiencing a Mental Health or Behavioral Health Crisis or need someone to talk to.   Olam Ku, RN, BSN [place]  [facility], [facility] RN Care Manager Direct Dial: [phone]  Fax: [phone]

## 2023-11-22 ENCOUNTER — Other Ambulatory Visit: Payer: Self-pay | Admitting: Family Medicine

## 2023-11-22 DIAGNOSIS — F331 Major depressive disorder, recurrent, moderate: Secondary | ICD-10-CM

## 2023-12-02 ENCOUNTER — Other Ambulatory Visit: Payer: Self-pay | Admitting: Family Medicine

## 2023-12-02 DIAGNOSIS — Z6841 Body Mass Index (BMI) 40.0 and over, adult: Secondary | ICD-10-CM

## 2023-12-02 DIAGNOSIS — B360 Pityriasis versicolor: Secondary | ICD-10-CM

## 2023-12-02 DIAGNOSIS — F331 Major depressive disorder, recurrent, moderate: Secondary | ICD-10-CM

## 2023-12-18 ENCOUNTER — Other Ambulatory Visit: Payer: Self-pay | Admitting: *Deleted

## 2023-12-18 NOTE — Patient Outreach (Addendum)
 Complex Care Management   Visit Note  12/18/2023  Name:  Molly Leon MRN: [id number] DOB: 01/04/1981  Situation: Referral received for Complex Care Management related to Pre-diabetes/Weight loss Management. I obtained verbal consent from Patient.  Visit completed with Patient  on the phone  Background:   Past Medical History:  Diagnosis Date   Chronic kidney disease    pt unaware of details but saw this on her mychart    Assessment: Patient Reported Symptoms:  Cognitive Cognitive Status: Able to follow simple commands, Normal speech and language skills, Alert and oriented to person, place, and time   Health Maintenance Behaviors: Annual physical exam  Neurological Neurological Review of Symptoms: No symptoms reported    HEENT HEENT Symptoms Reported: No symptoms reported      Cardiovascular Cardiovascular Symptoms Reported: No symptoms reported Does patient have uncontrolled Hypertension?: Yes Is patient checking Blood Pressure at home?: No Patient's Recent BP reading at home: 128/74 Provider's office visit on 12/09/2023 asymptomatic Cardiovascular Management Strategies: Routine screening, Weight management, Coping strategies Do You Have a Working Readable Scale?: No Weight: (!) 321 lb (145.6 kg) Cardiovascular Self-Management Outcome: 4 (good) Cardiovascular Comment: Patient reports recent consult with surgeon for interventional surgery for gastric sleeve she is considering this year. pending another follow up with a [facility] provider.  Respiratory Respiratory Symptoms Reported: No symptoms reported Respiratory Management Strategies: Routine screening  Endocrine Endocrine Symptoms Reported: No symptoms reported Is patient diabetic?: No Endocrine Comment: Prediabetic with last A1C 11/14/2023- 8.3%  Gastrointestinal Gastrointestinal Symptoms Reported: No symptoms reported      Genitourinary Genitourinary Symptoms Reported: No symptoms reported    Integumentary  Integumentary Symptoms Reported: No symptoms reported    Musculoskeletal Musculoskelatal Symptoms Reviewed: No symptoms reported        Psychosocial Psychosocial Symptoms Reported: No symptoms reported     Do you feel physically threatened by others?: No     There were no vitals filed for this visit.  Medications Reviewed Today     Reviewed by Alvia Olam BIRCH, RN (Registered Nurse) on 12/18/23 at 1536  Med List Status: <None>   Medication Order Taking? Sig Documenting Provider Last Dose Status Informant  buPROPion  (WELLBUTRIN  XL) 150 MG 24 hr tablet [id number] Yes Take 1 tablet (150 mg total) by mouth daily. Sebastian Beverley NOVAK, MD  Active   Elastic Bandages & Supports (MEDICAL COMPRESSION STOCKINGS) [place] [id number]  1 each by Does not apply route daily. Grade: Mild. 20 mmHg - 10 mmHg  Patient not taking: Reported on 10/02/2023   Sebastian Beverley NOVAK, MD  Active   esomeprazole  (NEXIUM ) 40 MG capsule [id number] Yes Take 1 capsule (40 mg total) by mouth daily. Sebastian Beverley NOVAK, MD  Active    Patient not taking:   Discontinued 02/08/20 1704 indapamide  (LOZOL ) 2.5 MG tablet [id number] Yes TAKE 1 TABLET(2.5 MG) BY MOUTH DAILY Sebastian Beverley NOVAK, MD  Active   methocarbamol  (ROBAXIN ) 500 MG tablet [id number]  Take 1 tablet (500 mg total) by mouth 2 (two) times daily.  Patient not taking: Reported on 10/02/2023   Jarold Olam HERO, PA-C  Active   naltrexone  (DEPADE) 50 MG tablet [id number] Yes Take 1 tablet (50 mg total) by mouth daily. Sebastian Beverley NOVAK, MD  Active   Med List Note Giacomo Camelia BIRCH, CPhT 07/17/16 2340): No pharmacy            Recommendation:   PCP Follow-up Continue Current Plan of Care  Follow Up Plan:   Telephone follow up appointment  date/time:  01/19/2024 @ 3:00 PM   Olam Ku, RN, BSN [facility], [facility] RN Care Manager Direct Dial: [phone]  Fax: [phone]

## 2023-12-18 NOTE — Patient Instructions (Signed)
 Visit Information  Molly Leon was given information about Medicaid Managed Care team care coordination services as a part of their Healthy [facility] Medicaid benefit. Roann Merk   If you would like to schedule transportation through your Healthy [facility] plan, please call the following number at least 2 days in advance of your appointment: [phone]  For information about your ride after you set it up, call Ride Assist at [phone]. Use this number to activate a Will Call pickup, or if your transportation is late for a scheduled pickup. Use this number, too, if you need to make a change or cancel a previously scheduled reservation.  If you need transportation services right away, call [phone]. The after-hours call center is staffed 24 hours to handle ride assistance and urgent reservation requests (including discharges) 365 days a year. Urgent trips include sick visits, hospital discharge requests and life-sustaining treatment.  Call the [facility] Line at [phone], at any time, 24 hours a day, 7 days a week. If you are in danger or need immediate medical attention call 911.   Please see education materials related to DASH(HTN), A1C (Diabetes), Pending GI surgery provided by MyChart link.  Patient verbalizes understanding of instructions and care plan provided today and agrees to view in MyChart. Active MyChart status and patient understanding of how to access instructions and care plan via MyChart confirmed with patient.     Telephone follow up appointment with Managed Medicaid care management team member scheduled for:   Olam Ku, RN, BSN [place]  [facility], [facility] RN Care Manager Direct Dial: [phone]  Fax: [phone]

## 2023-12-27 ENCOUNTER — Other Ambulatory Visit: Payer: Self-pay | Admitting: Medical Genetics

## 2023-12-27 DIAGNOSIS — Z006 Encounter for examination for normal comparison and control in clinical research program: Secondary | ICD-10-CM

## 2024-01-02 DIAGNOSIS — R7303 Prediabetes: Secondary | ICD-10-CM | POA: Diagnosis not present

## 2024-01-02 DIAGNOSIS — I10 Essential (primary) hypertension: Secondary | ICD-10-CM | POA: Diagnosis not present

## 2024-01-07 ENCOUNTER — Telehealth: Payer: Self-pay | Admitting: Family Medicine

## 2024-01-07 NOTE — Telephone Encounter (Signed)
 Paperwork was obtained and placed in provider review/sign folder on 01/07/2024 at 4:24pm

## 2024-01-07 NOTE — Telephone Encounter (Signed)
 Patient dropped off document bariatric pcp surgery support letter, to be filled out by provider. Patient requested to send it back via Call Patient to pick up within 7-days. Document is located in providers tray at front office.Please advise at Mobile [phone] (mobile) pt walked in and dropped off the form. I put in the dr box

## 2024-01-07 NOTE — Telephone Encounter (Signed)
 Noted

## 2024-01-09 ENCOUNTER — Telehealth: Admitting: Family Medicine

## 2024-01-09 ENCOUNTER — Ambulatory Visit: Admitting: Family Medicine

## 2024-01-09 DIAGNOSIS — Z01818 Encounter for other preprocedural examination: Secondary | ICD-10-CM

## 2024-01-09 NOTE — Progress Notes (Signed)
 Assessment & Plan   Assessment/Plan:    Problem List Items Addressed This Visit   None Visit Diagnoses       Preop examination    -  Primary           Assessment and Plan Assessment & Plan Morbid Obesity She is seeking medical clearance for bariatric surgery, planned before the end of the year. Comorbidities include CKD2, prediabetes, depression, primary hypertension, and hyperlipidemia. - Schedule an in-person appointment for medical clearance. - Perform vital signs assessment, including blood pressure and heart rate. - Consider obtaining lab work and an EKG.      There are no discontinued medications.          Subjective:   Encounter date: 01/09/2024  Molly Leon is a 43 y.o. 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   She  has a past medical history of Chronic kidney disease..   She presents with chief complaint of Surgery Clearance  (Pt presents today with forms for bariatric surgery  ) .   Discussed the use of AI scribe software for clinical note transcription with the patient, who gave verbal consent to proceed.  History of Present Illness Molly Leon is a 43 year old female with morbid obesity who presents for medical clearance for bariatric surgery.  Preoperative evaluation for bariatric surgery - Preparing for bariatric surgery planned  before the end of the year - Currently attending multiple pre-surgical appointments as part of the preparation process - Recently underwent EKG during an emergency room visit; results pending in medical chart - Difficulty attending in-person visits due to work schedule and multiple appointments  Morbid obesity - Morbid obesity present, requiring bariatric surgery evaluation  Chronic kidney disease - Chronic kidney disease stage 2  Prediabetes - Prediabetes present  Depressive symptoms - Depression present  Hypertension - Primary hypertension present  Hyperlipidemia - Hyperlipidemia present     ROS  Past Surgical History:  Procedure Laterality Date   NO PAST SURGERIES      Outpatient Medications Prior to Visit  Medication Sig Dispense Refill   acetaminophen  (TYLENOL ) 325 MG tablet Take 650 mg by mouth.     buPROPion  (WELLBUTRIN  XL) 150 MG 24 hr tablet Take 1 tablet (150 mg total) by mouth daily. 90 tablet 3   Elastic Bandages & Supports (MEDICAL COMPRESSION STOCKINGS) MISC 1 each by Does not apply route daily. Grade: Mild. 20 mmHg - 10 mmHg 1 each 0   esomeprazole  (NEXIUM ) 40 MG capsule Take 1 capsule (40 mg total) by mouth daily. 30 capsule 3   indapamide  (LOZOL ) 2.5 MG tablet TAKE 1 TABLET(2.5 MG) BY MOUTH DAILY 90 tablet 0   methocarbamol  (ROBAXIN ) 500 MG tablet Take 1 tablet (500 mg total) by mouth 2 (two) times daily. 20 tablet 0   naltrexone  (DEPADE) 50 MG tablet Take 1 tablet (50 mg total) by mouth daily. 90 tablet 3   No facility-administered medications prior to visit.    Family History  Problem Relation Age of Onset  Diabetes Mother    Hypertension Mother    Diabetes Father    Hypertension Father     Social History   Socioeconomic History   Marital status: Single    Spouse name: Not on file   Number of children: Not on file   Years of education: Not on file   Highest education level: 12th grade  Occupational History   Not on file  Tobacco Use    Smoking status: Former    Current packs/day: 0.00    Types: Cigarettes    Quit date: 2023    Years since quitting: 2.8    Passive exposure: Never   Smokeless tobacco: Never  Vaping Use   Vaping status: Never Used  Substance and Sexual Activity   Alcohol use: Yes    Comment: Rare   Drug use: No   Sexual activity: Not on file  Other Topics Concern   Not on file  Social History Narrative   Lives with her 2 children   Right handed   Caffeine : maybe 2 cups/day   Social Drivers of Corporate investment banker Strain: Low Risk  (12/09/2023)   Received from [facility]   Overall Financial Resource Strain (CARDIA)    How hard is it for you to pay for the very basics like food, housing, medical care, and heating?: Not hard at all  Food Insecurity: Food Insecurity Present (12/18/2023)   Hunger Vital Sign    Worried About Running Out of Food in the Last Year: Sometimes true    Ran Out of Food in the Last Year: Sometimes true  Transportation Needs: No Transportation Needs (12/18/2023)   PRAPARE - Administrator, Civil Service (Medical): No    Lack of Transportation (Non-Medical): No  Physical Activity: Inactive (10/02/2023)   Exercise Vital Sign    Days of Exercise per Week: 0 days    Minutes of Exercise per Session: Not on file  Stress: No Stress Concern Present (10/02/2023)   [employer] of Occupational Health - Occupational Stress Questionnaire    Feeling of Stress: Only a little  Recent Concern: Stress - Stress Concern Present (07/20/2023)   [employer] of Occupational Health - Occupational Stress Questionnaire    Feeling of Stress : To some extent  Social Connections: Socially Isolated (10/02/2023)   Social Connection and Isolation Panel    Frequency of Communication with Friends and Family: Twice a week    Frequency of Social Gatherings with Friends and Family: Once a week    Attends Religious Services: Never    Database administrator or Organizations: No     Attends Engineer, structural: Not on file    Marital Status: Never married  Intimate Partner Violence: Not At Risk (12/18/2023)   Humiliation, Afraid, Rape, and Kick questionnaire    Fear of Current or Ex-Partner: No    Emotionally Abused: No    Physically Abused: No    Sexually Abused: No  Objective:  Physical Exam: There were no vitals taken for this visit.   Physical Exam GENERAL: Alert, cooperative, well developed, no acute distress HEENT: Normocephalic, normal oropharynx, moist mucous membranes CHEST: Clear to auscultation bilaterally, No wheezes, rhonchi, or crackles CARDIOVASCULAR: Normal heart rate and rhythm, S1 and S2 normal without murmurs ABDOMEN: Soft, non-tender, non-distended, without organomegaly, Normal bowel sounds EXTREMITIES: No cyanosis or edema NEUROLOGICAL: Cranial nerves grossly intact, Moves all extremities without gross motor or sensory deficit   Physical Exam  ECHOCARDIOGRAM COMPLETE Result Date: 10/15/2023    ECHOCARDIOGRAM REPORT   Patient Name:   Molly Leon Date of Exam: 10/15/2023 Medical Rec #:  [id number]       Height:       71.0 in Accession #:    [id number]      Weight:       321.0 lb Date of Birth:  1980-03-19       BSA:          2.578 m Patient Age:    42 years        BP:           160/116 mmHg Patient Gender: F               HR:           81 bpm. Exam Location:  [address] Procedure: 2D Echo, 3D Echo, Cardiac Doppler, Color Doppler and Strain Analysis            (Both Spectral and Color Flow Doppler were utilized during            procedure). Indications:    I87.2 Edema  History:        Patient has no prior history of Echocardiogram examinations.                 Peripheral venous insufficiency; Risk Factors:Hypertension.  Sonographer:    Waldo Guadalajara RCS Referring Phys: [id number] BEVERLEY NOVAK Caelen Reierson IMPRESSIONS  1. Left ventricular ejection  fraction, by estimation, is 65 to 70%. The left ventricle has normal function. The left ventricle has no regional wall motion abnormalities. There is mild concentric left ventricular hypertrophy. Left ventricular diastolic parameters were normal. The average left ventricular global longitudinal strain is -18.4 %. The global longitudinal strain is normal.  2. Right ventricular systolic function is normal. The right ventricular size is normal.  3. The mitral valve is normal in structure. Trivial mitral valve regurgitation. Severe mitral stenosis.  4. The aortic valve is normal in structure. Aortic valve regurgitation is not visualized. No aortic stenosis is present.  5. The inferior vena cava is normal in size with greater than 50% respiratory variability, suggesting right atrial pressure of 3 mmHg. FINDINGS  Left Ventricle: Left ventricular ejection fraction, by estimation, is 65 to 70%. The left ventricle has normal function. The left ventricle has no regional wall motion abnormalities. The average left ventricular global longitudinal strain is -18.4 %. Strain was performed and the global longitudinal strain is normal. The left ventricular internal cavity size was normal in size. There is mild concentric left ventricular hypertrophy. Left ventricular diastolic parameters were normal. Right Ventricle: The right ventricular size is normal. No increase in right ventricular wall thickness. Right ventricular systolic function is normal. Left Atrium: Left atrial size was normal in size. Right Atrium: Right atrial size was normal in size. Pericardium: There is no evidence of pericardial effusion. Mitral Valve: The mitral valve is normal in structure. Trivial mitral valve regurgitation. Severe mitral  valve stenosis. Tricuspid Valve: The tricuspid valve is normal in structure. Tricuspid valve regurgitation is not demonstrated. No evidence of tricuspid stenosis. Aortic Valve: The aortic valve is normal in structure. Aortic  valve regurgitation is not visualized. No aortic stenosis is present. Pulmonic Valve: The pulmonic valve was normal in structure. Pulmonic valve regurgitation is not visualized. No evidence of pulmonic stenosis. Aorta: The aortic root is normal in size and structure. Venous: The inferior vena cava is normal in size with greater than 50% respiratory variability, suggesting right atrial pressure of 3 mmHg. IAS/Shunts: No atrial level shunt detected by color flow Doppler. Additional Comments: 3D was performed not requiring image post processing on an independent workstation and was normal.  LEFT VENTRICLE PLAX 2D LVIDd:         4.30 cm   Diastology LVIDs:         2.80 cm   LV e' medial:    7.29 cm/s LV PW:         1.30 cm   LV E/e' medial:  10.1 LV IVS:        1.30 cm   LV e' lateral:   7.29 cm/s LVOT diam:     2.00 cm   LV E/e' lateral: 10.1 LV SV:         79 LV SV Index:   31        2D Longitudinal Strain LVOT Area:     3.14 cm  2D Strain GLS (A4C):   -21.2 %                          2D Strain GLS (A3C):   -15.4 %                          2D Strain GLS (A2C):   -18.7 %                          2D Strain GLS Avg:     -18.4 %                           3D Volume EF:                          3D EF:        55 %                          LV EDV:       99 ml                          LV ESV:       44 ml                          LV SV:        54 ml RIGHT VENTRICLE RV Basal diam:  3.00 cm RV S prime:     11.10 cm/s TAPSE (M-mode): 2.0 cm LEFT ATRIUM             Index        RIGHT ATRIUM           Index LA diam:        4.10 cm 1.59 cm/m  RA Area:     13.10 cm LA Vol (A2C):   39.3 ml 15.24 ml/m  RA Volume:   31.40 ml  12.18 ml/m LA Vol (A4C):   49.1 ml 19.04 ml/m LA Biplane Vol: 45.9 ml 17.80 ml/m  AORTIC VALVE LVOT Vmax:   105.00 cm/s LVOT Vmean:  72.700 cm/s LVOT VTI:    0.251 m  AORTA Ao Root diam: 2.90 cm Ao Asc diam:  2.80 cm MITRAL VALVE MV Area (PHT):             SHUNTS MV Decel Time:             Systemic VTI:   0.25 m MV E velocity: 73.90 cm/s  Systemic Diam: 2.00 cm MV A velocity: 64.50 cm/s MV E/A ratio:  1.15 Toribio Fuel MD Electronically signed by Toribio Fuel MD Signature Date/Time: 10/15/2023/10:17:00 PM    Final     Recent Results (from the past 2160 hours)  ECHOCARDIOGRAM COMPLETE     Status: None   Collection Time: 10/15/23  9:26 AM  Result Value Ref Range   Area-P 1/2 4.83 cm2   S' Lateral 2.80 cm   Est EF 65 - 70%         Beverley Adine Hummer, MD, MS

## 2024-01-14 ENCOUNTER — Encounter: Payer: Self-pay | Admitting: Family Medicine

## 2024-01-14 ENCOUNTER — Ambulatory Visit (INDEPENDENT_AMBULATORY_CARE_PROVIDER_SITE_OTHER): Admitting: Family Medicine

## 2024-01-14 VITALS — BP 159/93 | HR 92 | Temp 97.0°F | Resp 18 | Wt 327.0 lb

## 2024-01-14 DIAGNOSIS — K219 Gastro-esophageal reflux disease without esophagitis: Secondary | ICD-10-CM | POA: Diagnosis not present

## 2024-01-14 DIAGNOSIS — N182 Chronic kidney disease, stage 2 (mild): Secondary | ICD-10-CM | POA: Diagnosis not present

## 2024-01-14 DIAGNOSIS — E66813 Obesity, class 3: Secondary | ICD-10-CM | POA: Diagnosis not present

## 2024-01-14 DIAGNOSIS — R079 Chest pain, unspecified: Secondary | ICD-10-CM | POA: Diagnosis not present

## 2024-01-14 DIAGNOSIS — Z114 Encounter for screening for human immunodeficiency virus [HIV]: Secondary | ICD-10-CM

## 2024-01-14 DIAGNOSIS — I10 Essential (primary) hypertension: Secondary | ICD-10-CM | POA: Diagnosis not present

## 2024-01-14 DIAGNOSIS — E782 Mixed hyperlipidemia: Secondary | ICD-10-CM | POA: Diagnosis not present

## 2024-01-14 DIAGNOSIS — G4733 Obstructive sleep apnea (adult) (pediatric): Secondary | ICD-10-CM | POA: Diagnosis not present

## 2024-01-14 DIAGNOSIS — F331 Major depressive disorder, recurrent, moderate: Secondary | ICD-10-CM | POA: Diagnosis not present

## 2024-01-14 DIAGNOSIS — Z1159 Encounter for screening for other viral diseases: Secondary | ICD-10-CM | POA: Diagnosis not present

## 2024-01-14 DIAGNOSIS — Z6841 Body Mass Index (BMI) 40.0 and over, adult: Secondary | ICD-10-CM | POA: Diagnosis not present

## 2024-01-14 DIAGNOSIS — Z1231 Encounter for screening mammogram for malignant neoplasm of breast: Secondary | ICD-10-CM

## 2024-01-14 DIAGNOSIS — R7303 Prediabetes: Secondary | ICD-10-CM | POA: Diagnosis not present

## 2024-01-14 DIAGNOSIS — B9681 Helicobacter pylori [H. pylori] as the cause of diseases classified elsewhere: Secondary | ICD-10-CM | POA: Diagnosis not present

## 2024-01-14 DIAGNOSIS — K2950 Unspecified chronic gastritis without bleeding: Secondary | ICD-10-CM | POA: Diagnosis not present

## 2024-01-14 MED ORDER — ESOMEPRAZOLE MAGNESIUM 40 MG PO CPDR: 40.0000 mg | DELAYED_RELEASE_CAPSULE | Freq: Every day | ORAL | 3 refills | Status: AC

## 2024-01-14 MED ORDER — VALSARTAN 320 MG PO TABS: 320.0000 mg | ORAL_TABLET | Freq: Every day | ORAL | 3 refills | Status: AC

## 2024-01-14 MED ORDER — NALTREXONE HCL 50 MG PO TABS: 50.0000 mg | ORAL_TABLET | Freq: Every day | ORAL | 3 refills | Status: AC

## 2024-01-14 MED ORDER — INDAPAMIDE 2.5 MG PO TABS: 2.5000 mg | ORAL_TABLET | Freq: Every day | ORAL | 3 refills | Status: AC

## 2024-01-14 MED ORDER — BUPROPION HCL ER (XL) 150 MG PO TB24: 150.0000 mg | ORAL_TABLET | Freq: Every day | ORAL | 3 refills | Status: AC

## 2024-01-14 NOTE — Progress Notes (Signed)
 Assessment & Plan   Assessment/Plan:    Assessment & Plan Preoperative Medical Clearance for Bariatric Surgery Preoperative clearance required for upcoming bariatric surgery. Blood pressure elevated at 159/93, above the target of 140/90 for surgical clearance. An endoscopic procedure earlier today may have contributed to elevated blood pressure. Previous chest x-ray and EKG from June were normal. She is anxious about the surgical process and the need for additional testing, expressing frustration with the preoperative process and emphasizing the importance of proceeding with surgery to improve her health and quality of life. - Order ECG for preoperative clearance - Screen for anemia with CBC - Check renal function with BMP - Order chest x-ray to screen for acute cardiopulmonary disease - Order microalbumin creatinine ratio and urinalysis with micro plus reflex to assess renal function and proteinuria - Repeat lipid panel - Order Apo A1 and B plus ratio and high sensitivity CRP for cardiovascular risk stratification - Recheck blood pressure - Discuss the need for blood pressure control prior to surgery  Essential Hypertension Blood pressure is elevated at 159/93. She is currently on indapamide  2.5 mg. Blood pressure needs to be controlled to below 140/90 for surgical clearance. She reports that blood pressure has been well-controlled at home and at the bariatric center. She is considering the addition of valsartan to her regimen to achieve target blood pressure. - Add valsartan 320 mg to current antihypertensive regimen - Recheck blood pressure in two weeks - Redraw BMP in two weeks to assess renal function - Follow up in one month to assess blood pressure  Obesity, Class 3 (BMI 45) Scheduled for bariatric surgery to address class 3 obesity. Current BMI is 45. She is following a program to prepare for surgery and is motivated to proceed with the procedure.  Chronic Kidney Disease, Stage  2 with Proteinuria Chronic kidney disease stage 2 with proteinuria. Renal function needs to be monitored as part of preoperative clearance. - Check renal function with BMP - Order microalbumin creatinine ratio and urinalysis with micro plus reflex  Prediabetes Prediabetes with need for further assessment of glycemic control and liver function. She is fasting today. - Order C-peptide, random insulin, and hemoglobin A1c - Check liver function and calculate FIB4 - Order CBC with platelets to calculate FIB4 for fatty liver disease  Mixed Hyperlipidemia Moderate mixed hyperlipidemia. Further risk stratification for cardiovascular disease is needed. - Repeat lipid panel - Order Apo A1 and B plus ratio and high sensitivity CRP  Depression Depression is well-managed with bupropion  150 mg and naltrexone . She reports improved mood and can notice a difference if a dose is missed. - Continue current medications  Screening for Breast Cancer (Mammogram) Due for a mammogram. She has opted to wait for scheduling. - Refer for mammogram  Patient declined pursue workup and left AGAINST MEDICAL ADVICE.      Medications Discontinued During This Encounter  Medication Reason   esomeprazole  (NEXIUM ) 40 MG capsule Reorder   naltrexone  (DEPADE) 50 MG tablet Reorder   buPROPion  (WELLBUTRIN  XL) 150 MG 24 hr tablet Reorder   indapamide  (LOZOL ) 2.5 MG tablet Reorder    No follow-ups on file.        Subjective:   Encounter date: 01/14/2024  Molly Leon is a 43 y.o. 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   She  has a past medical history of Chronic kidney disease..   She presents with chief complaint of Pre-op Exam (Pt is requesting bariatric surgical form to be completed for upcoming surgery //HM due- vaccinations, mammogram and cervical screening ) and Hypertension (Pt is currently taking (indapamide  2.5 MG); she does does not monitor blood pressure at home ) .   Discussed the use of AI scribe software for clinical note transcription with the patient, who gave verbal consent to proceed.  History of Present Illness Molly Leon is a 43 year old Leon with hypertension, class three obesity, chronic kidney disease stage two, prediabetes, and hyperlipidemia who presents for preoperative clearance for bariatric surgery.  Hypertension - Blood pressure usually well-controlled on indapamide  2.5 mg daily - Elevated blood pressure today, attributed to stress and fasting related to recent endoscopy - No chest pain, shortness of breath, or palpitations - History of leg swelling, improved with diuretic use - Blood pressure readings stable at bariatric center  Obesity and bariatric surgery preparation - Class III obesity with BMI of 45 - No significant weight loss since last visit - Actively working on dietary modifications in preparation for bariatric surgery - Expresses concern about delays in surgery due to additional preoperative evaluations - Delays in surgery impacting mental health and daily life  Chronic kidney disease - Stage 2 chronic kidney disease with proteinuria - No current symptoms of fluid overload or worsening renal function  Gastrointestinal symptoms and endoscopy - Underwent endoscopy earlier today - No abdominal pain, diarrhea, constipation, or blood in  stool - Esomeprazole  40 mg daily effectively controls reflux symptoms  Mood and psychiatric symptoms - Mood improved on bupropion  150 mg and naltrexone  - Mood worsens if a dose is missed  Hyperlipidemia and prediabetes - Moderate mixed hyperlipidemia - Prediabetes without current symptoms  Preoperative evaluation - Recent chest x-ray and EKG performed in June - Undergoing additional preoperative evaluations as required for bariatric surgery     ROS  Past Surgical History:  Procedure Laterality Date   NO PAST SURGERIES      Outpatient Medications Prior to Visit  Medication Sig Dispense Refill   acetaminophen  (TYLENOL ) 325 MG tablet Take 650 mg by mouth.     Elastic Bandages & Supports (MEDICAL COMPRESSION STOCKINGS) MISC 1 each by Does not apply route daily. Grade: Mild. 20 mmHg - 10 mmHg 1 each 0   methocarbamol  (ROBAXIN ) 500 MG tablet Take 1 tablet (500 mg total) by mouth 2 (two) times daily. 20 tablet 0   buPROPion  (WELLBUTRIN  XL) 150 MG 24 hr tablet Take 1 tablet (150 mg total) by mouth daily. 90 tablet 3   esomeprazole  (NEXIUM ) 40 MG capsule Take 1 capsule (40 mg total) by mouth daily. 30 capsule 3   indapamide  (LOZOL ) 2.5 MG tablet TAKE 1 TABLET(2.5 MG) BY MOUTH DAILY 90 tablet 0   naltrexone  (DEPADE) 50 MG tablet Take 1 tablet (50 mg total) by mouth daily. 90 tablet 3   No facility-administered medications prior to visit.    Family History  Problem Relation Age of Onset   Diabetes Mother    Hypertension Mother    Diabetes Father    Hypertension Father  Social History   Socioeconomic History   Marital status: Single    Spouse name: Not on file   Number of children: Not on file   Years of education: Not on file   Highest education level: 12th grade  Occupational History   Not on file  Tobacco Use   Smoking status: Former    Current packs/day: 0.00    Types: Cigarettes    Quit date: 2023    Years since quitting: 2.8    Passive exposure: Never    Smokeless tobacco: Never  Vaping Use   Vaping status: Never Used  Substance and Sexual Activity   Alcohol use: Yes    Comment: Rare   Drug use: No   Sexual activity: Not on file  Other Topics Concern   Not on file  Social History Narrative   Lives with her 2 children   Right handed   Caffeine : maybe 2 cups/day   Social Drivers of Corporate Investment Banker Strain: Low Risk  (12/09/2023)   Received from [facility]   Overall Financial Resource Strain (CARDIA)    How hard is it for you to pay for the very basics like food, housing, medical care, and heating?: Not hard at all  Food Insecurity: Food Insecurity Present (12/18/2023)   Hunger Vital Sign    Worried About Running Out of Food in the Last Year: Sometimes true    Ran Out of Food in the Last Year: Sometimes true  Transportation Needs: No Transportation Needs (12/18/2023)   PRAPARE - Administrator, Civil Service (Medical): No    Lack of Transportation (Non-Medical): No  Physical Activity: Inactive (10/02/2023)   Exercise Vital Sign    Days of Exercise per Week: 0 days    Minutes of Exercise per Session: Not on file  Stress: No Stress Concern Present (01/14/2024)   Received from [facility] - Occupational Stress Questionnaire    Do you feel stress - tense, restless, nervous, or anxious, or unable to sleep at night because your mind is troubled all the time - these days?: Not at all  Social Connections: Socially Isolated (10/02/2023)   Social Connection and Isolation Panel    Frequency of Communication with Friends and Family: Twice a week    Frequency of Social Gatherings with Friends and Family: Once a week    Attends Religious Services: Never    Database Administrator or Organizations: No    Attends Engineer, Structural: Not on file    Marital Status: Never married  Intimate Partner Violence: Not At Risk (12/18/2023)   Humiliation, Afraid, Rape, and Kick  questionnaire    Fear of Current or Ex-Partner: No    Emotionally Abused: No    Physically Abused: No    Sexually Abused: No                                                                                                  Objective:  Physical Exam: BP (!) 159/93 (BP Location: Right Arm, Patient Position: Sitting,  Cuff Size: Large)   Pulse 92   Temp (!) 97 F (36.1 C) (Temporal)   Resp 18   Wt (!) 327 lb (148.3 kg)   LMP 12/30/2023 (Exact Date)   SpO2 98%   BMI 45.61 kg/m    Physical Exam VITALS: P- 92, BP- 159/93 MEASUREMENTS: BMI- 45.0. GENERAL: Alert, cooperative, well developed, no acute distress HEENT: Normocephalic, normal oropharynx, moist mucous membranes CHEST: Clear to auscultation bilaterally, no wheezes, rhonchi, or crackles CARDIOVASCULAR: Normal heart rate and rhythm, S1 and S2 normal without murmurs ABDOMEN: Soft, non-tender, non-distended, without organomegaly, normal bowel sounds EXTREMITIES: No cyanosis or edema NEUROLOGICAL: Cranial nerves grossly intact, moves all extremities without gross motor or sensory deficit   Physical Exam  No results found.  No results found for this or any previous visit (from the past 2160 hours).      Beverley Adine Hummer, MD, MS

## 2024-01-15 NOTE — Telephone Encounter (Signed)
 Can we add Pt to the waiting list for a sooner appointment.

## 2024-01-15 NOTE — Telephone Encounter (Signed)
 Copied from CRM [id number]. Topic: General - Other >> Jan 15, 2024  3:56 PM Molly Leon DEL wrote: Reason for CRM: Patient would like to have a pre op appointment with PCP.PCP is booking out until Nov 19 for OV. Patient would like to know if theres any ways he could get squeezed in on providers schedule before then?

## 2024-01-16 NOTE — Telephone Encounter (Signed)
 Noted

## 2024-01-19 ENCOUNTER — Telehealth: Payer: Self-pay | Admitting: *Deleted

## 2024-01-19 ENCOUNTER — Encounter: Payer: Self-pay | Admitting: *Deleted

## 2024-01-19 DIAGNOSIS — F54 Psychological and behavioral factors associated with disorders or diseases classified elsewhere: Secondary | ICD-10-CM | POA: Diagnosis not present

## 2024-01-19 DIAGNOSIS — Z7189 Other specified counseling: Secondary | ICD-10-CM | POA: Diagnosis not present

## 2024-01-19 NOTE — Patient Instructions (Signed)
 Leslee Forte - I am sorry I was unable to reach you today for our scheduled appointment. I work with Sebastian Beverley NOVAK, MD and am calling to support your healthcare needs. Please contact me at [phone] at your earliest convenience. I look forward to speaking with you soon.   Thank you,   Olam Ku, RN, BSN [place]  [facility], [facility] RN Care Manager Direct Dial: [phone]  Fax: [phone]

## 2024-01-20 ENCOUNTER — Ambulatory Visit: Admitting: Family Medicine

## 2024-01-20 ENCOUNTER — Encounter: Payer: Self-pay | Admitting: Family Medicine

## 2024-01-20 NOTE — Progress Notes (Incomplete)
 Assessment & Plan   Assessment/Plan:    Problem List Items Addressed This Visit   None       Assessment and Plan               There are no discontinued medications.  No follow-ups on file.        Subjective:   Encounter date: 01/20/2024  Molly Leon is a 43 y.o. 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   She  has a past medical history of Chronic kidney disease..   She presents with chief complaint of No chief complaint on file. .   Discussed the use of AI scribe software for clinical note transcription with the patient, who gave verbal consent to proceed.  History of Present Illness           Preoperative Medical Clearance for Bariatric Surgery - At last visit, her blood pressure was elevated at 159/93, above the target of 140/90 for surgical clearance.  - Due for ECG, chest x-ray, CBC, microalbumin creatinine ratio, high CRP, lipid panel, Apo A1 and B plus ratio and BMP for post-operative clearance.     Essential Hypertension - Last blood pressure elevated at 159/93; notably earlier that day she had undergone an endoscopic procedure. - She is currently on indapamide  2.5 mg and Valsartan 320 mg daily.    Obesity, Class 3 (BMI 45) - Current BMI is 45.  - Scheduled for bariatric surgery to address class 3 obesity.   Chronic Kidney Disease, Stage  2 with Proteinuria - Last renal function in June was stable - Last microalbumin creatinine ratio was 46.4.  Depression - Well-managed with bupropion  150 mg and naltrexone .  - On 11/03 underwent her psychiatric evaluation for bariatric surgery.   ROS  Past Surgical History:  Procedure Laterality Date   NO PAST SURGERIES      Current Outpatient Medications on File Prior to Visit  Medication Sig Dispense Refill   acetaminophen  (TYLENOL ) 325 MG tablet Take 650 mg by mouth.     buPROPion  (WELLBUTRIN  XL) 150 MG 24 hr tablet Take 1 tablet (150 mg total) by mouth daily. 90 tablet 3   Elastic Bandages & Supports (MEDICAL COMPRESSION STOCKINGS) MISC 1 each by Does not apply route daily. Grade: Mild. 20 mmHg - 10 mmHg 1 each 0   esomeprazole  (NEXIUM ) 40 MG capsule Take 1 capsule (40 mg total) by mouth daily before breakfast. 90 capsule 3   indapamide  (LOZOL ) 2.5 MG tablet Take 1 tablet (2.5 mg total) by mouth daily. 90 tablet 3   methocarbamol  (ROBAXIN ) 500 MG tablet Take 1 tablet (500 mg total) by mouth 2 (two) times daily. 20 tablet 0   naltrexone  (DEPADE) 50 MG tablet Take 1 tablet (50 mg total) by mouth daily. 90 tablet 3   valsartan (DIOVAN) 320 MG tablet Take 1 tablet (320 mg total) by mouth daily. 90 tablet 3   [DISCONTINUED] hydrochlorothiazide  (HYDRODIURIL ) 25 MG tablet Take 1 tablet (25 mg total) by mouth daily. (Patient not taking: Reported  on 02/08/2020) 30 tablet 0   No current facility-administered medications on file prior to visit.    Family History  Problem Relation Age of Onset   Diabetes Mother    Hypertension Mother    Diabetes Father    Hypertension Father     Social History   Socioeconomic History   Marital status: Single    Spouse name: Not on file   Number of children: Not on file   Years of education: Not on file   Highest education level: 12th grade  Occupational History   Not on file  Tobacco Use   Smoking status: Former    Current packs/day: 0.00     Types: Cigarettes    Quit date: 2023    Years since quitting: 2.8    Passive exposure: Never   Smokeless tobacco: Never  Vaping Use   Vaping status: Never Used  Substance and Sexual Activity   Alcohol use: Yes    Comment: Rare   Drug use: No   Sexual activity: Not on file  Other Topics Concern   Not on file  Social History Narrative   Lives with her 2 children   Right handed   Caffeine : maybe 2 cups/day   Social Drivers of Corporate Investment Banker Strain: Low Risk  (12/09/2023)   Received from [facility]   Overall Financial Resource Strain (CARDIA)    How hard is it for you to pay for the very basics like food, housing, medical care, and heating?: Not hard at all  Food Insecurity: Food Insecurity Present (12/18/2023)   Hunger Vital Sign    Worried About Running Out of Food in the Last Year: Sometimes true    Ran Out of Food in the Last Year: Sometimes true  Transportation Needs: No Transportation Needs (12/18/2023)   PRAPARE - Administrator, Civil Service (Medical): No    Lack of Transportation (Non-Medical): No  Physical Activity: Inactive (10/02/2023)   Exercise Vital Sign    Days of Exercise per Week: 0 days    Minutes of Exercise per Session: Not on file  Stress: No Stress Concern Present (01/14/2024)   Received from [facility]ational [facility] - Occupational Stress Questionnaire    Do you feel stress - tense, restless, nervous, or anxious, or unable to sleep at night because your mind is troubled all the time - these days?: Not at all  Social Connections: Socially Isolated (10/02/2023)   Social Connection and Isolation Panel    Frequency of Communication with Friends and Family: Twice a week    Frequency of Social Gatherings with Friends and Family: Once a week    Attends Religious Services: Never    Database Administrator or Organizations: No    Attends Engineer, Structural: Not on file    Marital Status: Never  married  Intimate Partner Violence: Not At Risk (12/18/2023)   Humiliation, Afraid, Rape, and Kick questionnaire    Fear of Current or Ex-Partner: No    Emotionally Abused: No    Physically Abused: No    Sexually Abused: No  Objective:  Physical Exam: LMP 12/30/2023 (Exact Date)    Physical Exam           Physical Exam  No results found.  No results found for this or any previous visit (from the past 2160 hours).      Beverley KATHEE Hummer, MD  I,Emily Lagle,acting as a scribe for Beverley KATHEE Hummer, MD.,have documented all relevant documentation on the behalf of Beverley KATHEE Hummer, MD.  LILLETTE Beverley KATHEE Hummer, MD, have reviewed all documentation for this visit. The documentation on 01/20/2024 for the exam, diagnosis, procedures, and orders are all accurate and complete.

## 2024-01-21 ENCOUNTER — Ambulatory Visit (INDEPENDENT_AMBULATORY_CARE_PROVIDER_SITE_OTHER): Admitting: Family Medicine

## 2024-01-21 ENCOUNTER — Encounter: Payer: Self-pay | Admitting: Family Medicine

## 2024-01-21 VITALS — BP 138/88 | HR 86 | Temp 97.0°F | Resp 18 | Wt 316.0 lb

## 2024-01-21 DIAGNOSIS — Z01818 Encounter for other preprocedural examination: Secondary | ICD-10-CM

## 2024-01-21 DIAGNOSIS — Z6841 Body Mass Index (BMI) 40.0 and over, adult: Secondary | ICD-10-CM

## 2024-01-21 DIAGNOSIS — Z1159 Encounter for screening for other viral diseases: Secondary | ICD-10-CM

## 2024-01-21 DIAGNOSIS — E782 Mixed hyperlipidemia: Secondary | ICD-10-CM | POA: Diagnosis not present

## 2024-01-21 DIAGNOSIS — I10 Essential (primary) hypertension: Secondary | ICD-10-CM | POA: Diagnosis not present

## 2024-01-21 DIAGNOSIS — R7303 Prediabetes: Secondary | ICD-10-CM

## 2024-01-21 DIAGNOSIS — E66813 Obesity, class 3: Secondary | ICD-10-CM

## 2024-01-21 DIAGNOSIS — F331 Major depressive disorder, recurrent, moderate: Secondary | ICD-10-CM

## 2024-01-21 DIAGNOSIS — I129 Hypertensive chronic kidney disease with stage 1 through stage 4 chronic kidney disease, or unspecified chronic kidney disease: Secondary | ICD-10-CM | POA: Diagnosis not present

## 2024-01-21 DIAGNOSIS — N182 Chronic kidney disease, stage 2 (mild): Secondary | ICD-10-CM

## 2024-01-21 DIAGNOSIS — Z114 Encounter for screening for human immunodeficiency virus [HIV]: Secondary | ICD-10-CM

## 2024-01-21 NOTE — Progress Notes (Signed)
 Assessment & Plan   Assessment/Plan:   Assessment and Plan Assessment & Plan Preoperative Clearance for Bariatric Surgery Preoperative clearance required for bariatric surgery. Blood pressure is well-controlled at 138/88 mmHg. No symptoms of chest pain, shortness of breath, or other concerning symptoms. Heart and lungs sound normal on examination. - Proceed with lab work and workup as discussed - Will call with results and complete paperwork for surgery clearance  Class 3 Obesity BMI of 45, indicating class 3 obesity. Bariatric surgery planned as a treatment option.  Essential Hypertension Hypertension is well-controlled with current medication regimen. Blood pressure today is 138/88 mmHg. - Continue current medications: indapamide  2.5 mg and valsartan 320 mg  Chronic Kidney Disease, Stage 2 with Proteinuria Chronic kidney disease stage 2 with proteinuria. Last renal function in June was stable. Current microalbumin creatinine ratio is 46. - Ordered CMP to assess renal function - Ordered urinalysis with micro and reflex culture - Ordered repeat microalbumin creatinine ratio  Depression Well-managed with bupropion  150 mg and naltrexone  50 mg daily. Recent psychiatric evaluation completed prior to bariatric surgery. - Continue current medications: bupropion  150 mg and naltrexone  50 mg  Mixed Hyperlipidemia Cardiometabolic risk stratification required. - Ordered fasting lipid panel - Ordered Apo A1 and Apo B plus ratio - Ordered high sensitivity CRP  Prediabetes Screening for diabetes and insulin resistance required. - Ordered C-peptide - Ordered hemoglobin A1c - Ordered fasting or random insulin  Screening for HIV and Hepatitis C Due for HIV and hepatitis C screening as per CDC recommendations. - Ordered HIV and hepatitis C screening  Recording duration: 9 minutes      There are no discontinued medications.  No follow-ups on file.        Subjective:    Encounter date: 01/21/2024  Molly Leon is a 43 y.o. 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   She  has a past medical history of Chronic kidney disease..   She presents with chief complaint of Pre-op Exam (Pt is requesting bariatric form to be completed. //HM due- vaccinations ) .   Discussed the use of AI scribe software for clinical note transcription with the patient, who gave verbal consent to proceed.  History of Present Illness Molly Leon is a 43 year old female with class three obesity who presents for preoperative clearance for bariatric surgery.  Morbid obesity and preoperative evaluation - Class III obesity with BMI of 45 - Seeking preoperative clearance for bariatric surgery - Completed psychiatric evaluation in preparation for surgery  Hypertension - Treated with indapamide  2.5 mg daily and valsartan 320 mg daily  Chronic kidney disease - Stage 2 chronic kidney disease with proteinuria - Last renal function test in June was stable - Most recent microalbumin/creatinine ratio was 46  Depressive disorder - Depression well-managed with bupropion  150 mg daily and naltrexone  50 mg daily  Constitutional and systemic symptoms - No chest pain - No shortness of breath - No blood in stool - No abdominal discomfort - No urinary issues - No increased  urination - No excessive thirst      01/14/2024    4:39 PM 01/09/2024    9:53 AM 10/17/2023    3:23 PM 10/01/2023    7:22 PM 07/22/2023   10:52 AM  Depression screen PHQ 2/9  Decreased Interest 1 0 0 0 2  Down, Depressed, Hopeless 0 0 0 0 2  PHQ - 2 Score 1 0 0 0 4  Altered sleeping 0    2  Tired, decreased energy 1    2  Change in appetite 1    3  Feeling bad or failure about yourself  1    1  Trouble concentrating 0    1  Moving slowly or fidgety/restless 0    0  Suicidal thoughts 0    0  PHQ-9 Score 4    13  Difficult doing work/chores Not difficult at all    Not difficult at all      01/14/2024    4:39 PM 07/22/2023   10:52 AM 05/21/2023   11:35 AM  GAD 7 : Generalized Anxiety Score  Nervous, Anxious, on Edge 1 3 0  Control/stop worrying 0 2 2  Worry too much - different things 1 2 2   Trouble relaxing 1 1 1   Restless 0 0 0  Easily annoyed or irritable 0 3 3  Afraid - awful might happen 0 0 0  Total GAD 7 Score 3 11 8   Anxiety Difficulty Not difficult at all Not difficult at all Not difficult at all      ROS  Past Surgical History:  Procedure Laterality Date   NO PAST SURGERIES      Current Outpatient Medications on File Prior to Visit  Medication Sig Dispense Refill   acetaminophen  (TYLENOL ) 325 MG tablet Take 650 mg by mouth.     buPROPion  (WELLBUTRIN  XL) 150 MG 24 hr tablet Take 1 tablet (150 mg total) by mouth daily. 90 tablet 3   Elastic Bandages & Supports (MEDICAL COMPRESSION STOCKINGS) MISC 1 each by Does not apply route daily. Grade: Mild. 20 mmHg - 10 mmHg 1 each 0   esomeprazole  (NEXIUM ) 40 MG capsule Take 1 capsule (40 mg total) by mouth daily before breakfast. 90 capsule 3   indapamide  (LOZOL ) 2.5 MG tablet Take 1 tablet (2.5 mg total) by mouth daily. 90 tablet 3   methocarbamol  (ROBAXIN ) 500 MG tablet Take 1 tablet (500 mg total) by mouth 2 (two) times daily. 20 tablet 0   naltrexone  (DEPADE) 50 MG tablet Take 1 tablet (50 mg total) by mouth daily.  90 tablet 3   valsartan (DIOVAN) 320 MG tablet Take 1 tablet (320 mg total) by mouth daily. 90 tablet 3   [DISCONTINUED] hydrochlorothiazide  (HYDRODIURIL ) 25 MG tablet Take 1 tablet (25 mg total) by mouth daily. (Patient not taking: Reported on 02/08/2020) 30 tablet 0   No current facility-administered medications on file prior to visit.    Family History  Problem Relation Age of Onset   Diabetes Mother    Hypertension Mother    Diabetes Father    Hypertension Father     Social History   Socioeconomic History   Marital status: Single    Spouse name: Not on file   Number of children: Not on file   Years of education: Not on file   Highest education level: Some college, no degree  Occupational History   Not on file  Tobacco Use   Smoking status: Former    Current packs/day:  0.00    Types: Cigarettes    Quit date: 2023    Years since quitting: 2.8    Passive exposure: Never   Smokeless tobacco: Never  Vaping Use   Vaping status: Never Used  Substance and Sexual Activity   Alcohol use: Yes    Comment: Rare   Drug use: No   Sexual activity: Not on file  Other Topics Concern   Not on file  Social History Narrative   Lives with her 2 children   Right handed   Caffeine : maybe 2 cups/day   Social Drivers of Corporate Investment Banker Strain: Low Risk  (01/20/2024)   Overall Financial Resource Strain (CARDIA)    Difficulty of Paying Living Expenses: Not very hard  Food Insecurity: Food Insecurity Present (01/20/2024)   Hunger Vital Sign    Worried About Running Out of Food in the Last Year: Sometimes true    Ran Out of Food in the Last Year: Sometimes true  Transportation Needs: No Transportation Needs (01/20/2024)   PRAPARE - Administrator, Civil Service (Medical): No    Lack of Transportation (Non-Medical): No  Physical Activity: Sufficiently Active (01/20/2024)   Exercise Vital Sign    Days of Exercise per Week: 5 days    Minutes of Exercise per  Session: 60 min  Stress: No Stress Concern Present (01/20/2024)   [employer] of Occupational Health - Occupational Stress Questionnaire    Feeling of Stress: Only a little  Social Connections: Socially Isolated (01/20/2024)   Social Connection and Isolation Panel    Frequency of Communication with Friends and Family: Three times a week    Frequency of Social Gatherings with Friends and Family: Once a week    Attends Religious Services: Never    Database Administrator or Organizations: No    Attends Engineer, Structural: Not on file    Marital Status: Never married  Intimate Partner Violence: Not At Risk (12/18/2023)   Humiliation, Afraid, Rape, and Kick questionnaire    Fear of Current or Ex-Partner: No    Emotionally Abused: No    Physically Abused: No    Sexually Abused: No                                                                                                  Objective:  Physical Exam: BP 138/88 (BP Location: Right Arm, Patient Position: Sitting, Cuff Size: Large) Comment: recheck after 15 min resting  Pulse 86   Temp (!) 97 F (36.1 C) (Temporal)   Resp 18   Wt (!) 316 lb (143.3 kg)   LMP 12/30/2023 (Exact Date)   SpO2 98%   BMI 44.07 kg/m    Physical Exam           Physical Exam  No results found.  No results found for this or any previous visit (from the past 2160 hours).      Beverley KATHEE Hummer, MD  I,Emily Lagle,acting as a scribe for Beverley KATHEE Hummer, MD.,have documented all relevant documentation on the behalf of Beverley KATHEE Hummer,  MD.  Molly Beverley KATHEE Sebastian, MD, have reviewed all documentation for this visit. The documentation on 01/21/2024 for the exam, diagnosis, procedures, and orders are all accurate and complete.

## 2024-01-21 NOTE — Patient Instructions (Addendum)
 It was very nice to see you today!  VISIT SUMMARY: You came in today for preoperative clearance for your upcoming bariatric surgery. We discussed your current health conditions, including obesity, hypertension, chronic kidney disease, depression, and prediabetes. We also reviewed the necessary lab work and screenings required before your surgery.  YOUR PLAN: PREOPERATIVE CLEARANCE FOR BARIATRIC SURGERY: You need clearance for your upcoming bariatric surgery. -We will proceed with the lab work and workup as discussed. We will call you with the results and complete the paperwork for your surgery clearance.  CLASS 3 OBESITY: Your BMI is 45, which is classified as class 3 obesity. Bariatric surgery is planned as a treatment option. -Continue with the preparations for your bariatric surgery.  ESSENTIAL HYPERTENSION: Your blood pressure is well-controlled with your current medications. -Continue taking your current medications: indapamide  2.5 mg and valsartan 320 mg.  CHRONIC KIDNEY DISEASE, STAGE 2 WITH PROTEINURIA: You have stage 2 chronic kidney disease with protein in your urine. Your last renal function test in June was stable. -We have ordered a comprehensive metabolic panel (CMP) to assess your kidney function. -We have ordered a urinalysis with micro and reflex culture. -We have ordered a repeat microalbumin creatinine ratio.  DEPRESSION: Your depression is well-managed with your current medications. -Continue taking your current medications: bupropion  150 mg and naltrexone  50 mg.  MIXED HYPERLIPIDEMIA: You have mixed hyperlipidemia, which requires further assessment of your cardiometabolic risk. -We have ordered a fasting lipid panel. -We have ordered Apo A1 and Apo B plus ratio. -We have ordered a high sensitivity CRP test.  PREDIABETES: You need screening for diabetes and insulin resistance. -We have ordered a C-peptide test. -We have ordered a hemoglobin A1c test. -We have  ordered a fasting or random insulin test.  SCREENING FOR HIV AND HEPATITIS C: You are due for HIV and hepatitis C screening as per CDC recommendations. -We have ordered HIV and hepatitis C screening tests.  Return if symptoms worsen or fail to improve.   Take care, Molly Hummer, MD, MS   PLEASE NOTE:  If you had any lab tests, please let us  know if you have not heard back within a few days. You may see your results on mychart before we have a chance to review them but we will give you a call once they are reviewed by us .   If we ordered any referrals today, please let us  know if you have not heard from their office within the next week.   If you had any urgent prescriptions sent in today, please check with the pharmacy within an hour of our visit to make sure the prescription was transmitted appropriately.   Please try these tips to maintain a healthy lifestyle:  Eat at least 3 REAL meals and 1-2 snacks per day.  Aim for no more than 5 hours between eating.  If you eat breakfast, please do so within one hour of getting up.   Each meal should contain half fruits/vegetables, one quarter protein, and one quarter carbs (no bigger than a computer mouse)  Cut down on sweet beverages. This includes juice, soda, and sweet tea.   Drink at least 1 glass of water with each meal and aim for at least 8 glasses per day  Exercise at least 150 minutes every week.

## 2024-01-22 ENCOUNTER — Ambulatory Visit: Admitting: Family Medicine

## 2024-01-22 LAB — CBC WITH DIFFERENTIAL/PLATELET
Basophils Absolute: 0 K/uL (ref 0.0–0.1)
Basophils Relative: 0.8 % (ref 0.0–3.0)
Eosinophils Absolute: 0.3 K/uL (ref 0.0–0.7)
Eosinophils Relative: 4.9 % (ref 0.0–5.0)
HCT: 33.7 % — ABNORMAL LOW (ref 36.0–46.0)
Hemoglobin: 11.1 g/dL — ABNORMAL LOW (ref 12.0–15.0)
Lymphocytes Relative: 43.4 % (ref 12.0–46.0)
Lymphs Abs: 2.4 K/uL (ref 0.7–4.0)
MCHC: 33.1 g/dL (ref 30.0–36.0)
MCV: 80.5 fl (ref 78.0–100.0)
Monocytes Absolute: 0.5 K/uL (ref 0.1–1.0)
Monocytes Relative: 9.4 % (ref 3.0–12.0)
Neutro Abs: 2.3 K/uL (ref 1.4–7.7)
Neutrophils Relative %: 41.5 % — ABNORMAL LOW (ref 43.0–77.0)
Platelets: 296 K/uL (ref 150.0–400.0)
RBC: 4.18 Mil/uL (ref 3.87–5.11)
RDW: 14.7 % (ref 11.5–15.5)
WBC: 5.5 K/uL (ref 4.0–10.5)

## 2024-01-22 LAB — TSH RFX ON ABNORMAL TO FREE T4: TSH: 0.942 u[IU]/mL (ref 0.450–4.500)

## 2024-01-22 LAB — APO A1 + B + RATIO
Apolipo. B/A-1 Ratio: 0.7 ratio — ABNORMAL HIGH (ref 0.0–0.6)
Apolipoprotein A-1: 174 mg/dL (ref 116–209)
Apolipoprotein B: 118 mg/dL — ABNORMAL HIGH (ref <90)

## 2024-01-22 LAB — COMPREHENSIVE METABOLIC PANEL WITH GFR
ALT: 20 U/L (ref 0–35)
AST: 22 U/L (ref 0–37)
Albumin: 4.5 g/dL (ref 3.5–5.2)
Alkaline Phosphatase: 76 U/L (ref 39–117)
BUN: 16 mg/dL (ref 6–23)
CO2: 30 meq/L (ref 19–32)
Calcium: 9.3 mg/dL (ref 8.4–10.5)
Chloride: 96 meq/L (ref 96–112)
Creatinine, Ser: 0.78 mg/dL (ref 0.40–1.20)
GFR: 94.68 mL/min (ref >60.00)
Glucose, Bld: 82 mg/dL (ref 70–99)
Potassium: 3.8 meq/L (ref 3.5–5.1)
Sodium: 135 meq/L (ref 135–145)
Total Bilirubin: 0.3 mg/dL (ref 0.2–1.2)
Total Protein: 8.1 g/dL (ref 6.0–8.3)

## 2024-01-22 LAB — MICROALBUMIN / CREATININE URINE RATIO
Creatinine,U: 158.8 mg/dL
Microalb Creat Ratio: 70.5 mg/g — ABNORMAL HIGH (ref 0.0–30.0)
Microalb, Ur: 11.2 mg/dL — ABNORMAL HIGH (ref 0.0–1.9)

## 2024-01-22 LAB — HIGH SENSITIVITY CRP: CRP, High Sensitivity: 12.34 mg/L — ABNORMAL HIGH (ref 0.000–5.000)

## 2024-01-22 LAB — LIPID PANEL
Cholesterol: 199 mg/dL (ref 0–200)
HDL: 59.1 mg/dL (ref >39.00)
LDL Cholesterol: 116 mg/dL — ABNORMAL HIGH (ref 0–99)
NonHDL: 140.01
Total CHOL/HDL Ratio: 3
Triglycerides: 118 mg/dL (ref 0.0–149.0)
VLDL: 23.6 mg/dL (ref 0.0–40.0)

## 2024-01-22 LAB — HCV AB W REFLEX TO QUANT PCR: HCV Ab: NONREACTIVE

## 2024-01-22 LAB — HEMOGLOBIN A1C: Hgb A1c MFr Bld: 7.1 % — ABNORMAL HIGH (ref 4.6–6.5)

## 2024-01-22 LAB — HCV INTERPRETATION

## 2024-01-22 NOTE — Telephone Encounter (Signed)
 Copied from CRM [id number]. Topic: General - Other >> Jan 22, 2024 11:37 AM Molly Leon wrote: Reason for CRM: Patient checking on paperwork she left to be filled out by provider, and requests a return call when ready . Patient states a surgeon needs this paperwork before surgery

## 2024-01-22 NOTE — Telephone Encounter (Signed)
 Paperwork will be completed once we receive lab results as this was discussed with patient when she was discharged.

## 2024-01-23 ENCOUNTER — Ambulatory Visit: Payer: Self-pay | Admitting: Family Medicine

## 2024-01-23 ENCOUNTER — Telehealth: Payer: Self-pay | Admitting: Family Medicine

## 2024-01-23 DIAGNOSIS — D649 Anemia, unspecified: Secondary | ICD-10-CM

## 2024-01-23 DIAGNOSIS — E1169 Type 2 diabetes mellitus with other specified complication: Secondary | ICD-10-CM

## 2024-01-23 DIAGNOSIS — E1129 Type 2 diabetes mellitus with other diabetic kidney complication: Secondary | ICD-10-CM

## 2024-01-23 LAB — URINALYSIS W MICROSCOPIC + REFLEX CULTURE
Bacteria, UA: NONE SEEN /HPF
Bilirubin Urine: NEGATIVE
Glucose, UA: NEGATIVE
Hgb urine dipstick: NEGATIVE
Hyaline Cast: NONE SEEN /LPF
Ketones, ur: NEGATIVE
Leukocyte Esterase: NEGATIVE
Nitrites, Initial: NEGATIVE
RBC / HPF: NONE SEEN /HPF (ref 0–2)
Specific Gravity, Urine: 1.027 (ref 1.001–1.035)
WBC, UA: NONE SEEN /HPF (ref 0–5)
pH: 5.5 (ref 5.0–8.0)

## 2024-01-23 LAB — C-PEPTIDE: C-Peptide: 3.15 ng/mL (ref 0.80–3.85)

## 2024-01-23 LAB — HIV ANTIBODY (ROUTINE TESTING W REFLEX)
HIV 1&2 Ab, 4th Generation: NONREACTIVE
HIV FINAL INTERPRETATION: NEGATIVE

## 2024-01-23 LAB — INSULIN, RANDOM: Insulin: 22 u[IU]/mL — ABNORMAL HIGH

## 2024-01-23 LAB — NO CULTURE INDICATED

## 2024-01-23 MED ORDER — METFORMIN HCL ER 500 MG PO TB24: 500.0000 mg | ORAL_TABLET | Freq: Every day | ORAL | 0 refills | Status: AC

## 2024-01-23 MED ORDER — IRON (FERROUS SULFATE) 325 (65 FE) MG PO TABS: 325.0000 mg | ORAL_TABLET | Freq: Every day | ORAL | 3 refills | Status: AC

## 2024-01-23 MED ORDER — ROSUVASTATIN CALCIUM 5 MG PO TABS: 5.0000 mg | ORAL_TABLET | Freq: Every day | ORAL | 3 refills | Status: AC

## 2024-01-23 NOTE — Telephone Encounter (Signed)
 Pt called to question her pre op form being completed. When I tried to explain the reason and that they would be done she raised her voice and stated that it was not right that they were not completed. After speaking to [place] about it I went back to the tell her that Alan would call her back shortly. She then stated that she was on her way to pick up her form. She stated she can call but I'm on my way. When she arrived the form was completed and handed to her.

## 2024-01-23 NOTE — Telephone Encounter (Signed)
 Noted

## 2024-01-23 NOTE — Telephone Encounter (Signed)
 Paperwork was signed by Dr. Sebastian MD on 01/23/2024 after lab work was resulted. Pt picked up bariatric clearance form @ 4:10pm.

## 2024-01-26 ENCOUNTER — Telehealth: Payer: Self-pay | Admitting: *Deleted

## 2024-01-26 ENCOUNTER — Encounter: Payer: Self-pay | Admitting: *Deleted

## 2024-01-26 NOTE — Patient Instructions (Signed)
 Molly Leon - I am sorry I was unable to reach you today for our scheduled appointment. I work with Sebastian Beverley NOVAK, MD and am calling to support your healthcare needs. Please contact me at [phone] at your earliest convenience. I look forward to speaking with you soon.   Thank you,   Olam Ku, RN, BSN [place]  [facility], [facility] RN Care Manager Direct Dial: [phone]  Fax: [phone]

## 2024-01-26 NOTE — Telephone Encounter (Signed)
 Paperwork was given to Pt and also faxed with confirmation on 01/23/2024.

## 2024-01-27 ENCOUNTER — Encounter: Payer: Self-pay | Admitting: Family Medicine

## 2024-01-27 NOTE — Telephone Encounter (Signed)
Behavior warning letter sent via mail and [employer]

## 2024-02-02 ENCOUNTER — Other Ambulatory Visit: Payer: Self-pay | Admitting: *Deleted

## 2024-02-02 NOTE — Patient Outreach (Signed)
 Complex Care Management   Visit Note  02/02/2024  Name:  Molly Leon MRN: [id number] DOB: 23-Oct-1980  Situation: Referral received for Complex Care Management related to DMII/Weight loss I obtained verbal consent from Patient.  Visit completed with Patient  on the phone  Background:   Past Medical History:  Diagnosis Date   Chronic kidney disease    pt unaware of details but saw this on her mychart    Assessment: Pt reports she has undergone counseling for the upcoming surgery for a gastric sleeve and continue to met the requirements with her weight loss and pre-op workup with her provided completed on 01/21/2024. New onset of Diabetes added to today's assessment. Educational information will be added to her MyChart.  Patient Reported Symptoms:  Cognitive Cognitive Status: Able to follow simple commands, Alert and oriented to person, place, and time, Normal speech and language skills   Health Maintenance Behaviors: Annual physical exam  Neurological Neurological Review of Symptoms: No symptoms reported Neurological Management Strategies: Routine screening  HEENT HEENT Symptoms Reported: No symptoms reported HEENT Management Strategies: Routine screening    Cardiovascular Cardiovascular Symptoms Reported: No symptoms reported Does patient have uncontrolled Hypertension?: Yes Is patient checking Blood Pressure at home?: No Patient's Recent BP reading at home: Office visit on 01/21/2024 last BP 138/88 noted in EPIC Cardiovascular Management Strategies: Medication therapy, Routine screening, Coping strategies, Weight management Do You Have a Working Readable Scale?: Yes Cardiovascular Self-Management Outcome: 4 (good) Cardiovascular Comment: Pt undergo pre work up with final visit on 11/25 prior to scheduling a surgical date  Respiratory Respiratory Symptoms Reported: No symptoms reported Respiratory Management Strategies: Routine screening Respiratory Self-Management Outcome: 4  (good)  Endocrine Endocrine Symptoms Reported: No symptoms reported Is patient diabetic?: Yes (New onset of diabetes with Metformin started this month. A1C was 7.1 on 01/21/2024 office visit. Education will be provided.) Is patient checking blood sugars at home?: No Endocrine Self-Management Outcome: 4 (good)  Gastrointestinal Gastrointestinal Symptoms Reported: No symptoms reported      Genitourinary Genitourinary Symptoms Reported: No symptoms reported    Integumentary Integumentary Symptoms Reported: No symptoms reported Skin Management Strategies: Routine screening, Medication therapy Skin Self-Management Outcome: 4 (good)  Musculoskeletal Musculoskelatal Symptoms Reviewed: No symptoms reported        Psychosocial Psychosocial Symptoms Reported: No symptoms reported           There were no vitals filed for this visit. Pain Scale: 0-10 Pain Score: 0-No pain  Medications Reviewed Today     Reviewed by Alvia Olam BIRCH, RN (Registered Nurse) on 02/02/24 at 1058  Med List Status: <None>   Medication Order Taking? Sig Documenting Provider Last Dose Status Informant  acetaminophen  (TYLENOL ) 325 MG tablet [id number] Yes Take 650 mg by mouth. [provider]  Active   buPROPion  (WELLBUTRIN  XL) 150 MG 24 hr tablet [id number] Yes Take 1 tablet (150 mg total) by mouth daily. Sebastian Beverley NOVAK, MD  Active   Elastic Bandages & Supports (MEDICAL COMPRESSION STOCKINGS) [place] [id number] Yes 1 each by Does not apply route daily. Grade: Mild. 20 mmHg - 10 mmHg Sebastian Beverley NOVAK, MD  Active   esomeprazole  (NEXIUM ) 40 MG capsule [id number] Yes Take 1 capsule (40 mg total) by mouth daily before breakfast. Sebastian Beverley NOVAK, MD  Active    Patient not taking:   Discontinued 02/08/20 1704 indapamide  (LOZOL ) 2.5 MG tablet [id number] Yes Take 1 tablet (2.5 mg total) by mouth daily. Sebastian Beverley NOVAK, MD  Active   Iron, Ferrous  Sulfate, 325 (65 Fe) MG TABS [id number] Yes Take 325 mg by mouth daily.  Sebastian Beverley NOVAK, MD  Active   metFORMIN (GLUCOPHAGE-XR) 500 MG 24 hr tablet [id number] Yes Take 1 tablet (500 mg total) by mouth daily with breakfast. Sebastian Beverley NOVAK, MD  Active   methocarbamol  (ROBAXIN ) 500 MG tablet [id number]  Take 1 tablet (500 mg total) by mouth 2 (two) times daily.  Patient not taking: Reported on 02/02/2024   Jarold Olam HERO, PA-C  Active   naltrexone  (DEPADE) 50 MG tablet [id number] Yes Take 1 tablet (50 mg total) by mouth daily. Sebastian Beverley NOVAK, MD  Active   rosuvastatin (CRESTOR) 5 MG tablet [id number] Yes Take 1 tablet (5 mg total) by mouth daily. Sebastian Beverley NOVAK, MD  Active   valsartan (DIOVAN) 320 MG tablet [id number] Yes Take 1 tablet (320 mg total) by mouth daily. Sebastian Beverley NOVAK, MD  Active   Med List Note Giacomo Camelia BIRCH, CPhT 07/17/16 2340): No pharmacy            Recommendation:   PCP Follow-up Continue Current Plan of Care  Follow Up Plan:   Telephone follow up appointment date/time:  03/05/2024 b@ 3:30 pm   Olam Ku, RN, BSN [place]  [facility], [facility] RN Care Manager Direct Dial: [phone]  Fax: [phone]

## 2024-02-02 NOTE — Patient Instructions (Signed)
 Visit Information  Thank you for taking time to visit with me today. Please don't hesitate to contact me if I can be of assistance to you before our next scheduled appointment.  Your next care management appointment is by telephone on 03/05/2024 at 3:30 pm  Please call the care guide team at [phone] if you need to cancel, schedule, or reschedule an appointment.   Please call the Suicide and Crisis Lifeline: 988 call the [place]  National Suicide Prevention Lifeline: (762) 416-3047 or TTY: (787) 746-0557 TTY (628)221-4576) to talk to a trained counselor call 1-800-273-TALK (toll free, 24 hour hotline) if you are experiencing a Mental Health or Behavioral Health Crisis or need someone to talk to.   Olam Ku, RN, BSN [place]  [facility], [facility] RN Care Manager Direct Dial: [phone]  Fax: [phone]

## 2024-02-04 ENCOUNTER — Ambulatory Visit: Admitting: Family Medicine

## 2024-02-19 DIAGNOSIS — Z01818 Encounter for other preprocedural examination: Secondary | ICD-10-CM | POA: Diagnosis not present

## 2024-02-19 DIAGNOSIS — I10 Essential (primary) hypertension: Secondary | ICD-10-CM | POA: Diagnosis not present

## 2024-02-19 DIAGNOSIS — R9431 Abnormal electrocardiogram [ECG] [EKG]: Secondary | ICD-10-CM | POA: Diagnosis not present

## 2024-02-20 DIAGNOSIS — R9431 Abnormal electrocardiogram [ECG] [EKG]: Secondary | ICD-10-CM | POA: Diagnosis not present

## 2024-02-20 DIAGNOSIS — I10 Essential (primary) hypertension: Secondary | ICD-10-CM | POA: Diagnosis not present

## 2024-02-20 DIAGNOSIS — Z01818 Encounter for other preprocedural examination: Secondary | ICD-10-CM | POA: Diagnosis not present

## 2024-03-01 DIAGNOSIS — K219 Gastro-esophageal reflux disease without esophagitis: Secondary | ICD-10-CM | POA: Diagnosis not present

## 2024-03-01 DIAGNOSIS — I10 Essential (primary) hypertension: Secondary | ICD-10-CM | POA: Diagnosis not present

## 2024-03-05 ENCOUNTER — Telehealth: Admitting: *Deleted

## 2024-03-05 NOTE — Patient Outreach (Signed)
 Complex Care Management   Visit Note  03/05/2024  Name:  Molly Leon MRN: [id number] DOB: 1980-03-30  Situation: Referral received for Complex Care Management related to DM/Weight I obtained verbal consent from Patient.  Visit completed with Patient  on the phone  Background:   Past Medical History:  Diagnosis Date   Chronic kidney disease    pt unaware of details but saw this on her mychart    Assessment: Education provided today based upon pt's recent A1C on 01/21/2024 at 7.1% from 6.3%. Pt states she has never took the prescribed Metformin  and her provider is aware. RNCM educated on the importance of her now in the range of a diabetic and why is is important in managing this condition. Pt pending gastric sleeve in January in hopes of this improving her glucose levels. Will A=add information to pt's MyChart to assist with managing her ongoing diabetes. Patient Reported Symptoms:  Cognitive Cognitive Status: Able to follow simple commands, Alert and oriented to person, place, and time, Normal speech and language skills   Health Maintenance Behaviors: Annual physical exam Healing Pattern: Slow Health Facilitated by: Healthy diet  Neurological Neurological Review of Symptoms: No symptoms reported Neurological Management Strategies: Routine screening, Coping strategies Neurological Self-Management Outcome: 4 (good)  HEENT HEENT Symptoms Reported: No symptoms reported HEENT Management Strategies: Routine screening, Coping strategies HEENT Self-Management Outcome: 4 (good)    Cardiovascular Cardiovascular Symptoms Reported: No symptoms reported Does patient have uncontrolled Hypertension?: Yes Is patient checking Blood Pressure at home?: No Patient's Recent BP reading at home: Pt reports home device bp at 134/70's is the closest read she can remember. Pt remains asymptomatic. Cardiovascular Management Strategies: Medication therapy, Routine screening, Coping strategies Do You Have  a Working Readable Scale?: Yes Weight: (!) 316 lb (143.3 kg) Cardiovascular Self-Management Outcome: 4 (good) Cardiovascular Comment: Surgery date set on 03/24/2024 at [facility].  Respiratory Respiratory Symptoms Reported: No symptoms reported Respiratory Management Strategies: Coping strategies, Routine screening Respiratory Self-Management Outcome: 4 (good)  Endocrine Endocrine Symptoms Reported: No symptoms reported Is patient diabetic?: Yes (A1C was 7.1 on 01/21/2024 office visit. Providers are aware she does not wish to take the Metformin . RNCM educated accordingly.) Is patient checking blood sugars at home?: No (Pt now eligible for surgery for the GI sleeve in jan 2026. Education provided and RNCM stressed the importance of healthy eating habits) Endocrine Self-Management Outcome: 4 (good)  Gastrointestinal Gastrointestinal Symptoms Reported: No symptoms reported Gastrointestinal Management Strategies: Adequate rest, Coping strategies Gastrointestinal Self-Management Outcome: 4 (good)    Genitourinary Genitourinary Symptoms Reported: No symptoms reported Genitourinary Management Strategies: Adequate rest, Coping strategies Genitourinary Self-Management Outcome: 4 (good)  Integumentary Integumentary Symptoms Reported: No symptoms reported Skin Management Strategies: Medication therapy, Routine screening Skin Self-Management Outcome: 4 (good)  Musculoskeletal Musculoskelatal Symptoms Reviewed: No symptoms reported Musculoskeletal Management Strategies: Coping strategies, Adequate rest Musculoskeletal Self-Management Outcome: 4 (good) Falls in the past year?: No Number of falls in past year: 1 or less Was there an injury with Fall?: No Fall Risk Category Calculator: 0 Patient Fall Risk Level: Low Fall Risk Patient at Risk for Falls Due to: No Fall Risks  Psychosocial Psychosocial Symptoms Reported: No symptoms reported Behavioral Management Strategies: Adequate rest, Coping  strategies Behavioral Health Self-Management Outcome: 4 (good) Major Change/Loss/Stressor/Fears (CP): Denies Quality of Family Relationships: helpful, involved, supportive Do you feel physically threatened by others?: No     Today's Vitals   03/05/24 1545  Weight: (!) 316 lb (143.3 kg)   Pain Scale: 0-10 Pain  Score: 0-No pain  Medications Reviewed Today     Reviewed by Alvia Olam BIRCH, RN (Registered Nurse) on 03/05/24 at 1542  Med List Status: <None>   Medication Order Taking? Sig Documenting Provider Last Dose Status Informant  acetaminophen  (TYLENOL ) 325 MG tablet [id number] Yes Take 650 mg by mouth. [provider]  Active   buPROPion  (WELLBUTRIN  XL) 150 MG 24 hr tablet [id number] Yes Take 1 tablet (150 mg total) by mouth daily. Sebastian Beverley NOVAK, MD  Active   Elastic Bandages & Supports (MEDICAL COMPRESSION STOCKINGS) [place] [id number] Yes 1 each by Does not apply route daily. Grade: Mild. 20 mmHg - 10 mmHg Sebastian Beverley NOVAK, MD  Active   esomeprazole  (NEXIUM ) 40 MG capsule [id number] Yes Take 1 capsule (40 mg total) by mouth daily before breakfast. Sebastian Beverley NOVAK, MD  Active    Patient not taking:   Discontinued 02/08/20 1704 indapamide  (LOZOL ) 2.5 MG tablet [id number] Yes Take 1 tablet (2.5 mg total) by mouth daily. Sebastian Beverley NOVAK, MD  Active   Iron , Ferrous Sulfate , 325 (65 Fe) MG TABS [id number] Yes Take 325 mg by mouth daily. Sebastian Beverley NOVAK, MD  Active   metFORMIN  (GLUCOPHAGE -XR) 500 MG 24 hr tablet [id number]  Take 1 tablet (500 mg total) by mouth daily with breakfast.  Patient not taking: Reported on 03/05/2024   Sebastian Beverley NOVAK, MD  Active   methocarbamol  (ROBAXIN ) 500 MG tablet [id number]  Take 1 tablet (500 mg total) by mouth 2 (two) times daily.  Patient not taking: Reported on 02/02/2024   Jarold Olam HERO, PA-C  Active   naltrexone  (DEPADE) 50 MG tablet [id number] Yes Take 1 tablet (50 mg total) by mouth daily. Sebastian Beverley NOVAK, MD  Active   rosuvastatin   (CRESTOR ) 5 MG tablet [id number] Yes Take 1 tablet (5 mg total) by mouth daily. Sebastian Beverley NOVAK, MD  Active   valsartan  (DIOVAN ) 320 MG tablet [id number] Yes Take 1 tablet (320 mg total) by mouth daily. Sebastian Beverley NOVAK, MD  Active   Med List Note Giacomo Camelia BIRCH, CPhT 07/17/16 2340): No pharmacy            Recommendation:   PCP Follow-up Continue Current Plan of Care  Follow Up Plan:   Telephone follow up appointment date/time:  03/31/2024 @ 3:00 pm  Olam Alvia, RN, BSN [place]  [facility], [facility] RN Care Manager Direct Dial: [phone]  Fax: [phone]

## 2024-03-05 NOTE — Patient Instructions (Signed)
 Visit Information  Molly Leon was given information about Medicaid Managed Care team care coordination services as a part of their [facility] Community Plan Medicaid benefit.   If you would like to schedule transportation through your [facility], please call the following number at least 2 days in advance of your appointment: [phone]   Rides for urgent appointments can also be made after hours by calling Member Services.  Call the Behavioral Health Crisis Line at [phone], at any time, 24 hours a day, 7 days a week. If you are in danger or need immediate medical attention call 911.  Please see education materials related to Diabetes/A1C provided by MyChart link.  Care plan and visit instructions communicated with the patient verbally today. Patient agrees to receive a copy in MyChart. Active MyChart status and patient understanding of how to access instructions and care plan via MyChart confirmed with patient.     Telephone follow up appointment with Managed Medicaid care management team member scheduled for: 03/31/2024 @ 3:00 pm   Olam Ku, RN, BSN [place]  [facility], [facility] RN Care Manager Direct Dial: [phone]  Fax: [phone]    Following is a copy of your plan of care:   Goals Addressed             This Visit's Progress    VBCI RN Care Plan-DM   On track    Problems:  Knowledge Deficits related to DMII  Goal: Over the next 60 days the Patient to the consume a healthy eating habits as she is not take the previously prescribed Metformin  and her provider is aware. RNCM educated on the  Diabetes and A1c range and why it is important to manager this condition. Note pt pending surgical GI sleeve that may improve her eating habits and overall A1C. verbalize basic understanding of DMII disease process and self health management plan as evidenced by chart review and patient  reporting   Interventions:   Diabetes Interventions: Assessed patient's understanding of A1c goal: <6.5% Provided education to patient about basic DM disease process Reviewed medications with patient and discussed importance of medication adherence Counseled on importance of regular laboratory monitoring as prescribed Discussed plans with patient for ongoing care management follow up and provided patient with direct contact information for care management team Review of patient status, including review of consultants reports, relevant laboratory and other test results, and medications completed Assessed social determinant of health barriers Much discussion on diabetes and ways to manage this condition Education on diabetes for hypo-hyperglycemia and what to do if acute symptoms occur Lab Results  Component Value Date   HGBA1C 7.1 (H) 01/21/2024    Patient Self-Care Activities:  Attend all scheduled provider appointments Attend church or other social activities Call pharmacy for medication refills 3-7 days in advance of running out of medications Call provider office for new concerns or questions  Perform all self care activities independently  Perform IADL's (shopping, preparing meals, housekeeping, managing finances) independently Take medications as prescribed   check feet daily for cuts, sores or redness set goal weight drink 6 to 8 glasses of water each day eat fish at least once per week fill half of plate with vegetables limit fast food meals to no more than 1 per week manage portion size switch to sugar-free drinks wear comfortable, cotton socks wear comfortable, well-fitting shoes  Plan:  Telephone follow up appointment with care management team member scheduled for:  03/31/2024 @ 3:00 pm  VBCI RN Care Plan-Weight loss   On track    Problems:  Chronic Disease Management support and education needs related to prediabetes/weight loss. Patient verified her  visit with a specialist for surgical intervention (Gastric sleeve) for her weight loss at [facility].   Goal: Over the next 60 days the patient will attend all scheduled medical appointments: discussed as evidenced by chart reviewed and patient report        verbalize understanding of plan for management of  weight loss as evidenced by increase mobility with exercises and healthy eating habits.  Interventions:  Weight Loss Interventions: Continued to educate patient on life style modifications related to dietary changes: avoid fad diets, make small/incremental dietary and exercise changes, eat at the table and avoid eating in front of the TV, plan management of cravings, monitor snacking and cravings in food diary Continue to encourage lifestyle modifications with nutritional counseling on healthy eating habits, physical activity promotion with three days weekly increase ambulation 10-15 minutes walks.   Encouraged behavioral changes related to setting goals, self monitoring, stress management, coping skills and limiting emotional stress eating. Encouraged pt to consume her last meal before 8 pm and increase her mobility. Will add educational information to MyChart as discussed today for gastric sleeve/DASH diet/ A1C   Patient Self-Care Activities:  Attend all scheduled provider appointments  Plan:  Telephone follow up appointment with care management team member scheduled for:  03/31/2024 @ 3:00 pm

## 2024-03-31 ENCOUNTER — Other Ambulatory Visit: Payer: Self-pay

## 2024-03-31 NOTE — Patient Instructions (Signed)
 Visit Information  Molly Leon was given information about Medicaid Managed Care team care coordination services as a part of their [facility] Community Plan Medicaid benefit.   If you would like to schedule transportation through your [facility], please call the following number at least 2 days in advance of your appointment: [phone]   Rides for urgent appointments can also be made after hours by calling Member Services.  Call the Behavioral Health Crisis Line at [phone], at any time, 24 hours a day, 7 days a week. If you are in danger or need immediate medical attention call 911.    Care plan and visit instructions communicated with the patient verbally today. Patient agrees to receive a copy in MyChart. Active MyChart status and patient understanding of how to access instructions and care plan via MyChart confirmed with patient.     Telephone follow up appointment with Managed Medicaid care management team member scheduled for: 04/21/2024 @ 2:30pm  Elida Pulse, RNCM Case Manager [facility], Population Health Direct Dial: [phone]   Following is a copy of your plan of care:   Goals Addressed             This Visit's Progress    VBCI RN Care Plan-DM   On track    Problems:  Knowledge Deficits related to DMII  Goal: Over the next 60 days the Patient to the consume a healthy eating habits as she is not take the previously prescribed Metformin  and her provider is aware. RNCM educated on the  Diabetes and A1c range and why it is important to manager this condition. Note pt pending surgical GI sleeve that may improve her eating habits and overall A1C. verbalize basic understanding of DMII disease process and self health management plan as evidenced by chart review and patient reporting   Interventions:   Diabetes Interventions: Assessed patient's understanding of A1c goal: <6.5% Provided education to  patient about basic DM disease process Reviewed medications with patient and discussed importance of medication adherence Counseled on importance of regular laboratory monitoring as prescribed Discussed plans with patient for ongoing care management follow up and provided patient with direct contact information for care management team Review of patient status, including review of consultants reports, relevant laboratory and other test results, and medications completed Assessed social determinant of health barriers Much discussion on diabetes and ways to manage this condition Education on diabetes for hypo-hyperglycemia and what to do if acute symptoms occur UPDATE 03/31/2024: Had gastric sleeve procedure on 03/24/2024. She reports she feels great. She is down 13 pounds. She is walking everyday and sticking to her nutrition plan from bariatrics. She denies and concerns. Her incisions are healing well. Saw nurse today in bariatric office and steristrips were removed today. She is starting applesauce and jello later today.She is not checking her BP or BS at home regularly. We discussed how that could help her stay on track esp while eating liquids and small amounts of food to ensure BS does not get too low. Will see Bariatric Surgeon on 04/08/2024.  Lab Results  Component Value Date   HGBA1C 7.1 (H) 01/21/2024    Patient Self-Care Activities:  Attend all scheduled provider appointments Attend church or other social activities Call pharmacy for medication refills 3-7 days in advance of running out of medications Call provider office for new concerns or questions  Perform all self care activities independently  Perform IADL's (shopping, preparing meals, housekeeping, managing finances) independently  Take medications as prescribed   check feet daily for cuts, sores or redness set goal weight drink 6 to 8 glasses of water each day eat fish at least once per week fill half of plate with  vegetables limit fast food meals to no more than 1 per week manage portion size switch to sugar-free drinks wear comfortable, cotton socks wear comfortable, well-fitting shoes  Plan:  Telephone follow up appointment with care management team member scheduled for:  03/31/2024 @ 3:00 pm          VBCI RN Care Plan-Weight loss   Improving    Problems:  Chronic Disease Management support and education needs related to prediabetes/weight loss. Patient verified her visit with a specialist for surgical intervention (Gastric sleeve) for her weight loss at [facility].   Goal: Over the next 60 days the patient will attend all scheduled medical appointments: discussed as evidenced by chart reviewed and patient report        verbalize understanding of plan for management of  weight loss as evidenced by increase mobility with exercises and healthy eating habits.  Interventions:  Weight Loss Interventions: Continued to educate patient on life style modifications related to dietary changes: avoid fad diets, make small/incremental dietary and exercise changes, eat at the table and avoid eating in front of the TV, plan management of cravings, monitor snacking and cravings in food diary Continue to encourage lifestyle modifications with nutritional counseling on healthy eating habits, physical activity promotion with three days weekly increase ambulation 10-15 minutes walks.   Encouraged behavioral changes related to setting goals, self monitoring, stress management, coping skills and limiting emotional stress eating. Encouraged pt to consume her last meal before 8 pm and increase her mobility. Will add educational information to MyChart as discussed today for gastric sleeve/DASH diet/ A1C  UPDATE 03/31/2024: Had gastric sleeve and is down 13 pounds. She is walking everyday  and slowly increasing exercise. She is strictly following bariatric nutrition plan.    Patient Self-Care Activities:  Attend all  scheduled provider appointments  Plan:  Telephone follow up appointment with care management team member scheduled for:  04/21/2024 @ 2:30 pm

## 2024-03-31 NOTE — Patient Outreach (Signed)
 Complex Care Management   Visit Note  03/31/2024  Name:  Molly Leon MRN: [id number] DOB: 1980-06-28  Situation: Referral received for Complex Care Management related to DMII and weight I obtained verbal consent from Patient.  Visit completed with Molly Leon  on the phone  Molly Leon is in great spirits. She is feeling good after gastric sleeve procedure on 03/24/2024. She is down 13 pounds and strictly following nutrition plan from bariatrics. She is walking every day. Had steristrips removed from abdominal. incisions today and sites without drainage, redness or open area.Denies any concerns or issues at this time.   Background:   Past Medical History:  Diagnosis Date   Chronic kidney disease    pt unaware of details but saw this on her mychart    Assessment: Patient Reported Symptoms:  Cognitive Cognitive Status: No symptoms reported, Alert and oriented to person, place, and time, Insightful and able to interpret abstract concepts, Normal speech and language skills   Health Maintenance Behaviors: Annual physical exam, Exercise, Healthy diet, Sleep adequate  Neurological Neurological Review of Symptoms: No symptoms reported Neurological Management Strategies: Exercise, Routine screening, Weight management Neurological Self-Management Outcome: 5 (very good)  HEENT HEENT Symptoms Reported: No symptoms reported HEENT Self-Management Outcome: 5 (very good)    Cardiovascular Cardiovascular Symptoms Reported: No symptoms reported Does patient have uncontrolled Hypertension?: No Is patient checking Blood Pressure at home?: Yes Patient's Recent BP reading at home: 126/86 today at bariatric appt. Cardiovascular Management Strategies: Medication therapy, Diet modification, Exercise, Fluid modification, Weight management, Routine screening, Adequate rest Cardiovascular Self-Management Outcome: 4 (good)  Respiratory Respiratory Symptoms Reported: No symptoms reported Respiratory  Management Strategies: Activity, Exercise, Weight management, Routine screening Respiratory Self-Management Outcome: 5 (very good)  Endocrine Endocrine Symptoms Reported: No symptoms reported Is patient diabetic?: Yes Is patient checking blood sugars at home?: No Endocrine Self-Management Outcome: 5 (very good) Endocrine Comment: A1c= 7.1 on 01/21/2024  Gastrointestinal Gastrointestinal Symptoms Reported: Other Other Gastrointestinal Symptoms: had gastric sleeve procedure 03/24/2024. Gastrointestinal Management Strategies: Adequate rest, Exercise, Fluid modification, Diet modification, Coping strategies, Medication therapy Gastrointestinal Self-Management Outcome: 5 (very good)    Genitourinary Genitourinary Symptoms Reported: Not assessed    Integumentary Integumentary Symptoms Reported: Incision Additional Integumentary Details: She has 5 stapled incisions on her abdomen from gatric sleeve procedure. All wounds are without redness, swelling or drainage and incisions closed. Steri strips removed at the office today Skin Management Strategies: Adequate rest, Dressing changes, Exercise, Medication therapy, Weight management, Routine screening Skin Self-Management Outcome: 5 (very good)  Musculoskeletal Musculoskelatal Symptoms Reviewed: No symptoms reported Additional Musculoskeletal Details: walking every day. Goes out to [place] or other large stores to walk. Musculoskeletal Management Strategies: Exercise, Diet modification, Medication therapy, Fluid modification, Weight management Musculoskeletal Self-Management Outcome: 5 (very good)      Psychosocial Psychosocial Symptoms Reported: Not assessed          03/31/2024    PHQ2-9 Depression Screening   Little interest or pleasure in doing things    Feeling down, depressed, or hopeless    PHQ-2 - Total Score    Trouble falling or staying asleep, or sleeping too much    Feeling tired or having little energy    Poor appetite or  overeating     Feeling bad about yourself - or that you are a failure or have let yourself or your family down    Trouble concentrating on things, such as reading the newspaper or watching television    Moving or speaking so slowly  that other people could have noticed.  Or the opposite - being so fidgety or restless that you have been moving around a lot more than usual    Thoughts that you would be better off dead, or hurting yourself in some way    PHQ2-9 Total Score    If you checked off any problems, how difficult have these problems made it for you to do your work, take care of things at home, or get along with other people    Depression Interventions/Treatment      There were no vitals filed for this visit.    Medications Reviewed Today   Medications were not reviewed in this encounter     Recommendation:   Continue Current Plan of Care  Follow Up Plan:   Telephone follow up appointment date/time:  04/21/2024 @ 2:30 pm  Elida Pulse, RNCM Case Manager [facility], [facility] Health Direct Dial: [phone]

## 2024-04-21 ENCOUNTER — Other Ambulatory Visit: Payer: Self-pay

## 2024-04-21 NOTE — Patient Instructions (Addendum)
 Leslee Forte - I am sorry I was unable to reach you today for our scheduled appointment. I work with Sebastian Beverley NOVAK, MD and am calling to support your healthcare needs. Please contact me at [phone] at your earliest convenience. I scheduled another call for 04/23/2024 at 3:45 pm. I look forward to speaking with you soon.  Thank you,  Elida Pulse, RNCM Case Manager [facility], Population Health Direct Dial: [phone]

## 2024-04-23 ENCOUNTER — Telehealth: Payer: Self-pay

## 2024-04-23 NOTE — Patient Instructions (Addendum)
 Leslee Forte - I am sorry I was unable to reach you today for our scheduled appointment. I work with Sebastian Beverley NOVAK, MD and am calling to support your healthcare needs. Please contact me at [phone] at your earliest convenience. I have rescheduled out call to 04/29/2024 at 2:45 pm. I look forward to speaking with you soon.   Thank you,  Elida Pulse, RNCM Case Manager [facility], Population Health Direct Dial: [phone]

## 2024-04-29 ENCOUNTER — Telehealth
